# Patient Record
Sex: Female | Born: 1960 | Race: Black or African American | Hispanic: No | Marital: Single | Smoking: Current some day smoker
Health system: Southern US, Community
[De-identification: ages and names within clinical notes are randomized; demographics above are authoritative.]

## PROBLEM LIST (undated history)

## (undated) DIAGNOSIS — I10 Essential (primary) hypertension: Secondary | ICD-10-CM

## (undated) DIAGNOSIS — S7292XA Unspecified fracture of left femur, initial encounter for closed fracture: Secondary | ICD-10-CM

## (undated) DIAGNOSIS — E119 Type 2 diabetes mellitus without complications: Secondary | ICD-10-CM

## (undated) DIAGNOSIS — M1990 Unspecified osteoarthritis, unspecified site: Secondary | ICD-10-CM

## (undated) DIAGNOSIS — G8929 Other chronic pain: Secondary | ICD-10-CM

## (undated) DIAGNOSIS — E7800 Pure hypercholesterolemia, unspecified: Secondary | ICD-10-CM

## (undated) DIAGNOSIS — Z720 Tobacco use: Secondary | ICD-10-CM

## (undated) DIAGNOSIS — E785 Hyperlipidemia, unspecified: Secondary | ICD-10-CM

## (undated) HISTORY — DX: Unspecified fracture of left femur, initial encounter for closed fracture: S72.92XA

## (undated) HISTORY — DX: Tobacco use: Z72.0

## (undated) HISTORY — DX: Unspecified osteoarthritis, unspecified site: M19.90

## (undated) HISTORY — DX: Other chronic pain: G89.29

## (undated) HISTORY — PX: CARPAL TUNNEL RELEASE: SHX101

## (undated) HISTORY — DX: Type 2 diabetes mellitus without complications: E11.9

## (undated) HISTORY — DX: Essential (primary) hypertension: I10

## (undated) HISTORY — DX: Hyperlipidemia, unspecified: E78.5

## (undated) HISTORY — DX: Pure hypercholesterolemia, unspecified: E78.00

## (undated) HISTORY — PX: COLONOSCOPY: SHX174

---

## 2005-03-15 ENCOUNTER — Ambulatory Visit: Payer: Self-pay | Admitting: Family Medicine

## 2005-04-01 ENCOUNTER — Ambulatory Visit: Payer: Self-pay | Admitting: Family Medicine

## 2005-09-28 ENCOUNTER — Ambulatory Visit (HOSPITAL_COMMUNITY): Admission: RE | Admit: 2005-09-28 | Discharge: 2005-09-28 | Payer: Self-pay | Admitting: Specialist

## 2006-12-19 IMAGING — CR DG ORBITS FOR FOREIGN BODY
2 series · 2 of 2 positions shown · non-contrast
Comparison: none

CLINICAL DATA: Pre-MRI.  Exam for patient who works with metal. 
 EYE FOREIGN BODY:
 No radiopaque foreign body.

[w waters *]
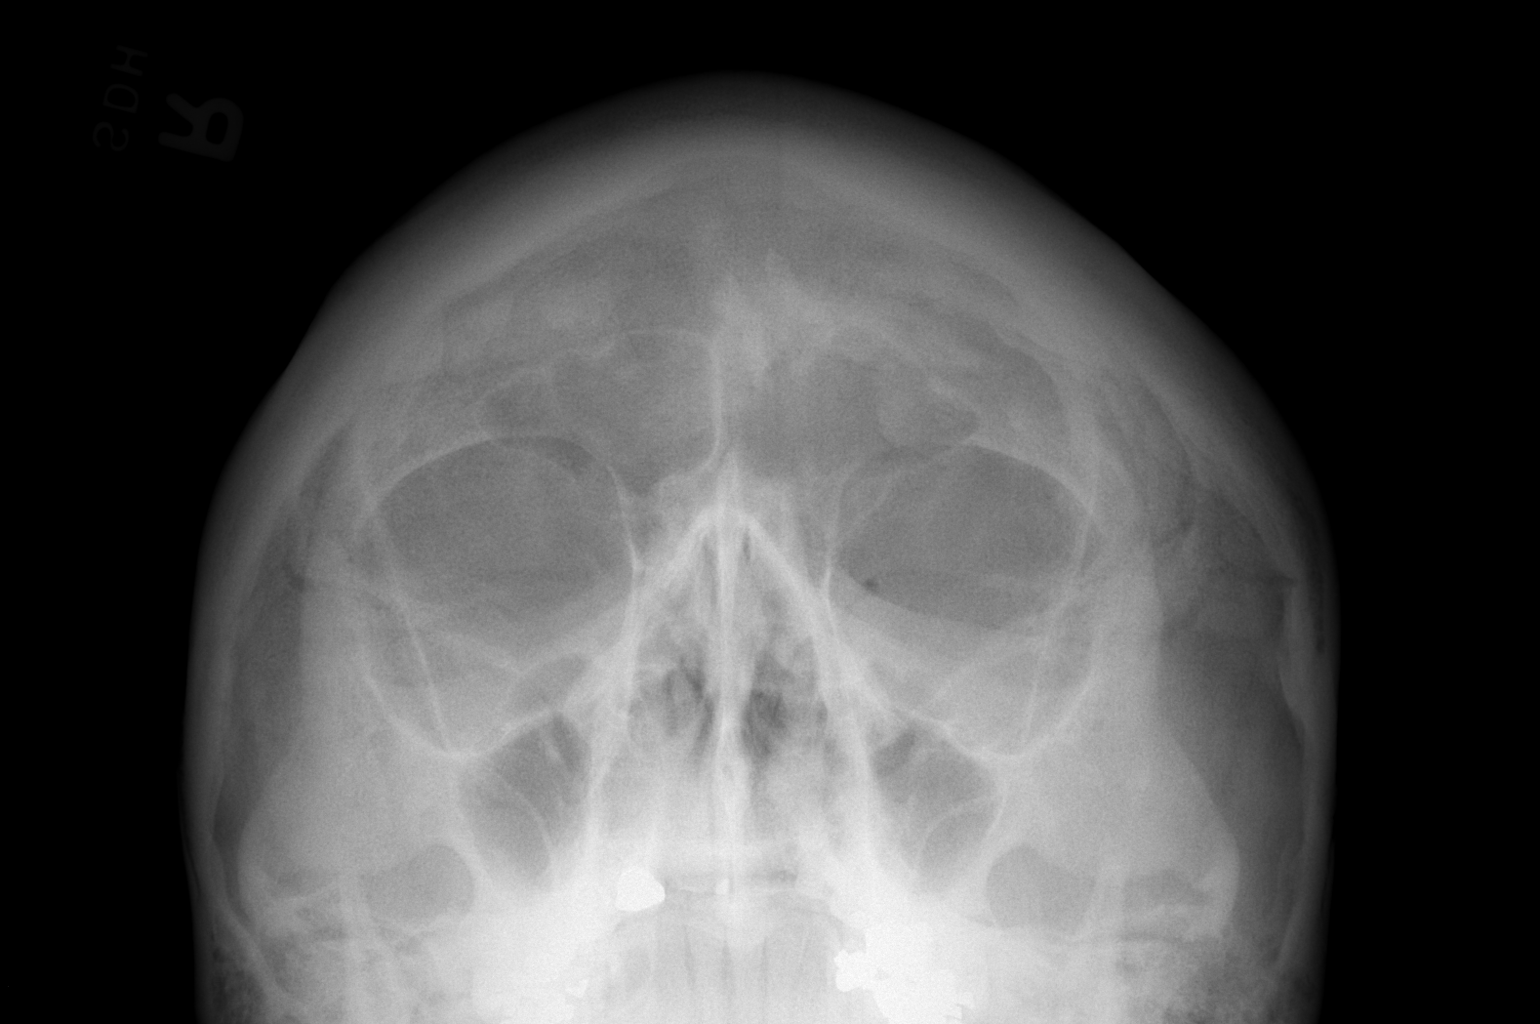

[w waters]
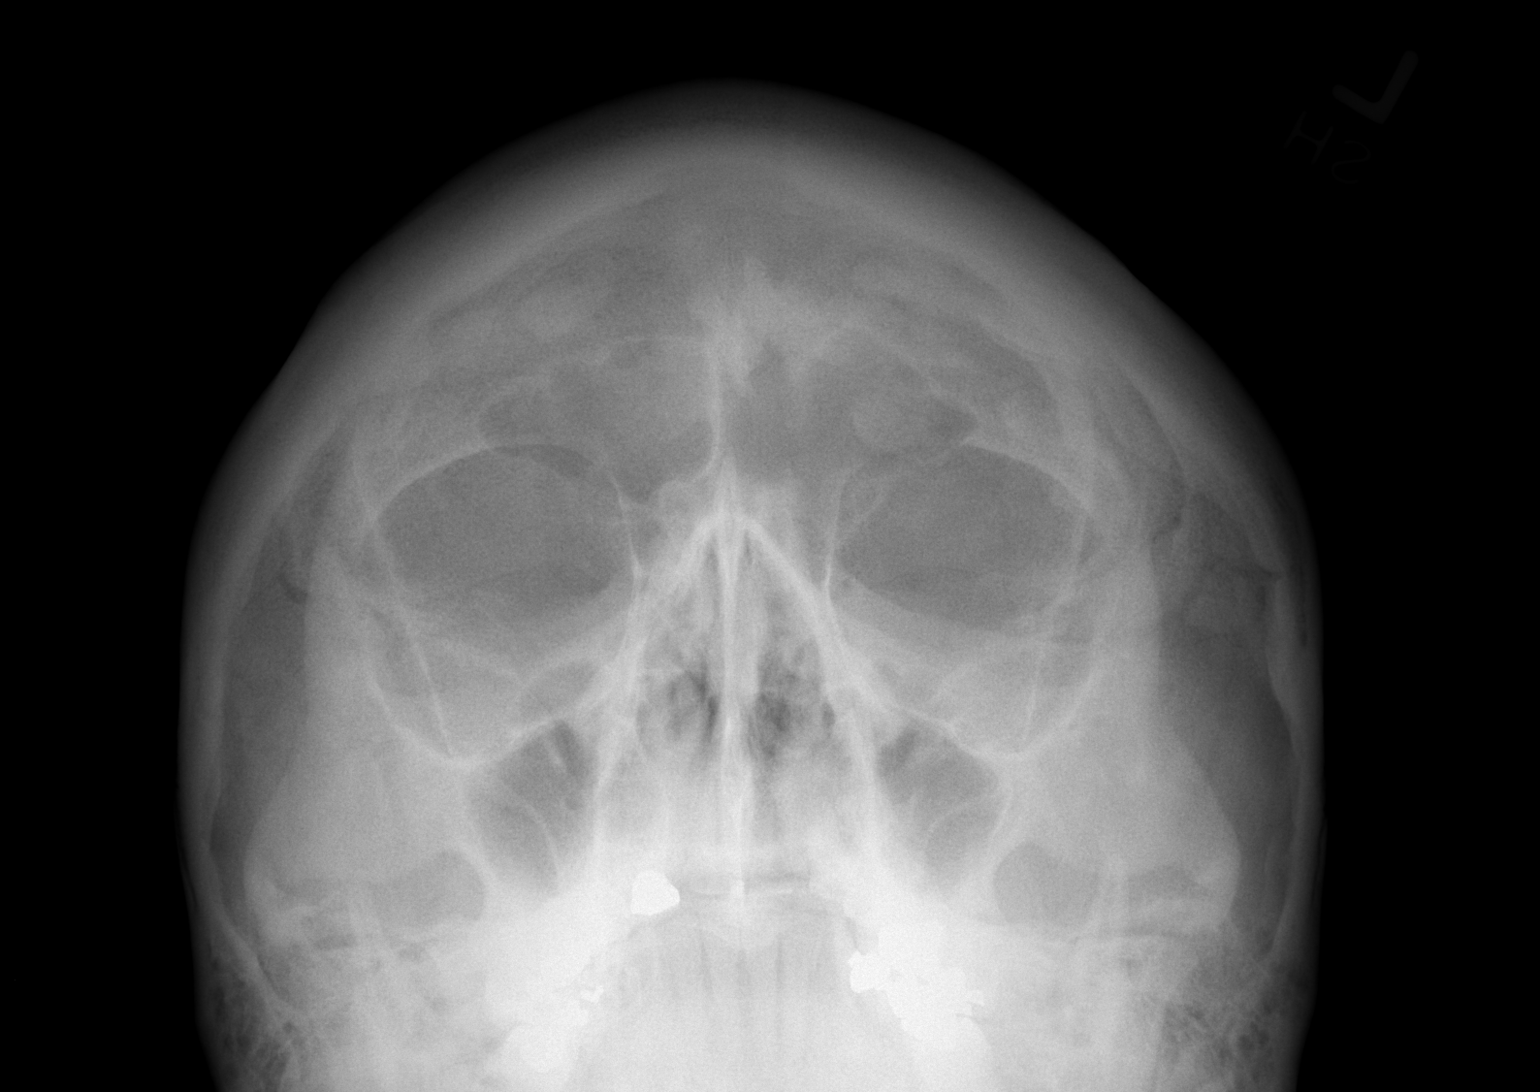

[2 of 2 positions shown; findings below may reference images not displayed]

IMPRESSION: The patient may proceed to MRI.

## 2016-04-18 DIAGNOSIS — E119 Type 2 diabetes mellitus without complications: Secondary | ICD-10-CM | POA: Diagnosis not present

## 2016-04-18 DIAGNOSIS — E663 Overweight: Secondary | ICD-10-CM | POA: Diagnosis not present

## 2016-04-18 DIAGNOSIS — I10 Essential (primary) hypertension: Secondary | ICD-10-CM | POA: Diagnosis not present

## 2016-04-18 DIAGNOSIS — Z713 Dietary counseling and surveillance: Secondary | ICD-10-CM | POA: Diagnosis not present

## 2016-08-08 DIAGNOSIS — E119 Type 2 diabetes mellitus without complications: Secondary | ICD-10-CM | POA: Diagnosis not present

## 2016-08-08 DIAGNOSIS — I10 Essential (primary) hypertension: Secondary | ICD-10-CM | POA: Diagnosis not present

## 2016-08-08 DIAGNOSIS — Z713 Dietary counseling and surveillance: Secondary | ICD-10-CM | POA: Diagnosis not present

## 2016-08-08 DIAGNOSIS — K5900 Constipation, unspecified: Secondary | ICD-10-CM | POA: Diagnosis not present

## 2016-09-16 DIAGNOSIS — J22 Unspecified acute lower respiratory infection: Secondary | ICD-10-CM | POA: Diagnosis not present

## 2016-10-04 DIAGNOSIS — R05 Cough: Secondary | ICD-10-CM | POA: Diagnosis not present

## 2016-10-04 DIAGNOSIS — J0140 Acute pansinusitis, unspecified: Secondary | ICD-10-CM | POA: Diagnosis not present

## 2016-10-05 ENCOUNTER — Ambulatory Visit (INDEPENDENT_AMBULATORY_CARE_PROVIDER_SITE_OTHER): Payer: BLUE CROSS/BLUE SHIELD | Admitting: Sports Medicine

## 2016-10-05 ENCOUNTER — Encounter: Payer: Self-pay | Admitting: Sports Medicine

## 2016-10-05 DIAGNOSIS — M2042 Other hammer toe(s) (acquired), left foot: Secondary | ICD-10-CM

## 2016-10-05 DIAGNOSIS — E119 Type 2 diabetes mellitus without complications: Secondary | ICD-10-CM | POA: Diagnosis not present

## 2016-10-05 DIAGNOSIS — L84 Corns and callosities: Secondary | ICD-10-CM

## 2016-10-05 NOTE — Progress Notes (Signed)
Subjective: Cherlyn Cushingamela Nugent is a 55 y.o. Diabetic female patient who presents to office for evaluation of Left foot pain. Patient complains of progressive pain especially over the last year in the Left foot at the 4-5th toes. Patient has tried soaking and stone with no relief in symptoms. Patient denies any other pedal complaints.   FBS not recorded  A1c 7.1 per patient   There are no active problems to display for this patient.   No current outpatient prescriptions on file prior to visit.   No current facility-administered medications on file prior to visit.     Not on File  Objective:  General: Alert and oriented x3 in no acute distress  Dermatology: Small hyperkeratotic lesion overlying 5 PIPJ dorsally on left foot with kertasis 4th webspace. No open lesions bilateral lower extremities, no webspace macerations, no ecchymosis bilateral, all nails x 10 are short and thickened mildly discolored but well manicured.  Vascular: Dorsalis Pedis and Posterior Tibial pedal pulses 2/4, Capillary Fill Time 3 seconds,(+) pedal hair growth bilateral, no edema bilateral lower extremities, Temperature gradient within normal limits.  Neurology: Gross sensation intact via light touch bilateral, Protective sensation intact with Semmes Weinstein Monofilament to all pedal sites, vibratory intact bilateral, No babinski sign present bilateral. Subjective occassional numbness to feet when her back is hurting. Has history of bulging disc lumbar and cervical.   Musculoskeletal: Semi-flexible hammertoes 5 with varus rotation with mild tenderness with palpation at PIPJ Left. Ankle, Subtalar, Midtarsal, and MTPJ joint range of motion is within normal limits, there is no 1st ray hypermobility noted bilateral, Mild bunion deformity noted bilateral. No pain with calf compression bilateral.  Strength within normal limits in all groups bilateral.        Assessment and Plan: Problem List Items Addressed This Visit     None    Visit Diagnoses    Corns and callosities    -  Primary   Hammertoe of left foot       Diabetes mellitus without complication (HCC)           -Complete examination performed -Discussed treatement options corn secondary to hammertoe -Mechanically debrided keratosis x 2 left foot using sterile chisel blade without incident -Gave silicone toe cap/pad -Recommend daily skin emollients, daily foot inspection in setting of diabetes, and good supportive shoes -Patient to return to office in 3 months or sooner if condition worsens. Encouraged patient to consider surgery if continues to be bothersome.   Asencion Islamitorya Cloyce Paterson, DPM

## 2016-10-05 NOTE — Patient Instructions (Signed)
Diabetes and Foot Care Diabetes may cause you to have problems because of poor blood supply (circulation) to your feet and legs. This may cause the skin on your feet to become thinner, break easier, and heal more slowly. Your skin may become dry, and the skin may peel and crack. You may also have nerve damage in your legs and feet causing decreased feeling in them. You may not notice minor injuries to your feet that could lead to infections or more serious problems. Taking care of your feet is one of the most important things you can do for yourself.  HOME CARE INSTRUCTIONS  Wear shoes at all times, even in the house. Do not go barefoot. Bare feet are easily injured.  Check your feet daily for blisters, cuts, and redness. If you cannot see the bottom of your feet, use a mirror or ask someone for help.  Wash your feet with warm water (do not use hot water) and mild soap. Then pat your feet and the areas between your toes until they are completely dry. Do not soak your feet as this can dry your skin.  Apply a moisturizing lotion or petroleum jelly (that does not contain alcohol and is unscented) to the skin on your feet and to dry, brittle toenails. Do not apply lotion between your toes.  Trim your toenails straight across. Do not dig under them or around the cuticle. File the edges of your nails with an emery board or nail file.  Do not cut corns or calluses or try to remove them with medicine.  Wear clean socks or stockings every day. Make sure they are not too tight. Do not wear knee-high stockings since they may decrease blood flow to your legs.  Wear shoes that fit properly and have enough cushioning. To break in new shoes, wear them for just a few hours a day. This prevents you from injuring your feet. Always look in your shoes before you put them on to be sure there are no objects inside.  Do not cross your legs. This may decrease the blood flow to your feet.  If you find a minor scrape,  cut, or break in the skin on your feet, keep it and the skin around it clean and dry. These areas may be cleansed with mild soap and water. Do not cleanse the area with peroxide, alcohol, or iodine.  When you remove an adhesive bandage, be sure not to damage the skin around it.  If you have a wound, look at it several times a day to make sure it is healing.  Do not use heating pads or hot water bottles. They may burn your skin. If you have lost feeling in your feet or legs, you may not know it is happening until it is too late.  Make sure your health care provider performs a complete foot exam at least annually or more often if you have foot problems. Report any cuts, sores, or bruises to your health care provider immediately. SEEK MEDICAL CARE IF:   You have an injury that is not healing.  You have cuts or breaks in the skin.  You have an ingrown nail.  You notice redness on your legs or feet.  You feel burning or tingling in your legs or feet.  You have pain or cramps in your legs and feet.  Your legs or feet are numb.  Your feet always feel cold. SEEK IMMEDIATE MEDICAL CARE IF:   There is increasing redness,   swelling, or pain in or around a wound.  There is a red line that goes up your leg.  Pus is coming from a wound.  You develop a fever or as directed by your health care provider.  You notice a bad smell coming from an ulcer or wound.   This information is not intended to replace advice given to you by your health care provider. Make sure you discuss any questions you have with your health care provider.   Document Released: 12/09/2000 Document Revised: 08/14/2013 Document Reviewed: 05/21/2013 Elsevier Interactive Patient Education 2016 [employer]  

## 2017-01-05 ENCOUNTER — Ambulatory Visit: Payer: BLUE CROSS/BLUE SHIELD | Admitting: Sports Medicine

## 2017-01-30 DIAGNOSIS — E559 Vitamin D deficiency, unspecified: Secondary | ICD-10-CM | POA: Diagnosis not present

## 2017-01-30 DIAGNOSIS — R5383 Other fatigue: Secondary | ICD-10-CM | POA: Diagnosis not present

## 2017-01-30 DIAGNOSIS — Z79899 Other long term (current) drug therapy: Secondary | ICD-10-CM | POA: Diagnosis not present

## 2017-01-30 DIAGNOSIS — E785 Hyperlipidemia, unspecified: Secondary | ICD-10-CM | POA: Diagnosis not present

## 2017-01-30 DIAGNOSIS — E1169 Type 2 diabetes mellitus with other specified complication: Secondary | ICD-10-CM | POA: Diagnosis not present

## 2017-01-30 DIAGNOSIS — I10 Essential (primary) hypertension: Secondary | ICD-10-CM | POA: Diagnosis not present

## 2017-01-30 DIAGNOSIS — F329 Major depressive disorder, single episode, unspecified: Secondary | ICD-10-CM | POA: Diagnosis not present

## 2017-03-01 DIAGNOSIS — I10 Essential (primary) hypertension: Secondary | ICD-10-CM | POA: Diagnosis not present

## 2017-03-01 DIAGNOSIS — E1169 Type 2 diabetes mellitus with other specified complication: Secondary | ICD-10-CM | POA: Diagnosis not present

## 2017-03-01 DIAGNOSIS — R51 Headache: Secondary | ICD-10-CM | POA: Diagnosis not present

## 2017-03-01 DIAGNOSIS — E785 Hyperlipidemia, unspecified: Secondary | ICD-10-CM | POA: Diagnosis not present

## 2017-03-13 DIAGNOSIS — R51 Headache: Secondary | ICD-10-CM | POA: Diagnosis not present

## 2017-03-13 DIAGNOSIS — E785 Hyperlipidemia, unspecified: Secondary | ICD-10-CM | POA: Diagnosis not present

## 2017-03-13 DIAGNOSIS — E1169 Type 2 diabetes mellitus with other specified complication: Secondary | ICD-10-CM | POA: Diagnosis not present

## 2017-03-13 DIAGNOSIS — M47812 Spondylosis without myelopathy or radiculopathy, cervical region: Secondary | ICD-10-CM | POA: Diagnosis not present

## 2017-05-09 DIAGNOSIS — I10 Essential (primary) hypertension: Secondary | ICD-10-CM | POA: Diagnosis not present

## 2017-05-09 DIAGNOSIS — E1169 Type 2 diabetes mellitus with other specified complication: Secondary | ICD-10-CM | POA: Diagnosis not present

## 2017-05-09 DIAGNOSIS — E785 Hyperlipidemia, unspecified: Secondary | ICD-10-CM | POA: Diagnosis not present

## 2017-05-09 DIAGNOSIS — E11628 Type 2 diabetes mellitus with other skin complications: Secondary | ICD-10-CM | POA: Diagnosis not present

## 2017-05-09 DIAGNOSIS — L84 Corns and callosities: Secondary | ICD-10-CM | POA: Diagnosis not present

## 2017-05-09 DIAGNOSIS — E559 Vitamin D deficiency, unspecified: Secondary | ICD-10-CM | POA: Diagnosis not present

## 2017-05-09 DIAGNOSIS — Z720 Tobacco use: Secondary | ICD-10-CM | POA: Diagnosis not present

## 2017-05-10 DIAGNOSIS — H35033 Hypertensive retinopathy, bilateral: Secondary | ICD-10-CM | POA: Diagnosis not present

## 2017-05-10 DIAGNOSIS — E119 Type 2 diabetes mellitus without complications: Secondary | ICD-10-CM | POA: Diagnosis not present

## 2017-05-17 DIAGNOSIS — E1169 Type 2 diabetes mellitus with other specified complication: Secondary | ICD-10-CM | POA: Diagnosis not present

## 2017-05-17 DIAGNOSIS — E785 Hyperlipidemia, unspecified: Secondary | ICD-10-CM | POA: Diagnosis not present

## 2017-05-17 DIAGNOSIS — E669 Obesity, unspecified: Secondary | ICD-10-CM | POA: Diagnosis not present

## 2017-06-14 DIAGNOSIS — E785 Hyperlipidemia, unspecified: Secondary | ICD-10-CM | POA: Diagnosis not present

## 2017-06-14 DIAGNOSIS — E1169 Type 2 diabetes mellitus with other specified complication: Secondary | ICD-10-CM | POA: Diagnosis not present

## 2017-06-14 DIAGNOSIS — E559 Vitamin D deficiency, unspecified: Secondary | ICD-10-CM | POA: Diagnosis not present

## 2017-08-25 DIAGNOSIS — E559 Vitamin D deficiency, unspecified: Secondary | ICD-10-CM | POA: Diagnosis not present

## 2017-08-25 DIAGNOSIS — R42 Dizziness and giddiness: Secondary | ICD-10-CM | POA: Diagnosis not present

## 2017-08-25 DIAGNOSIS — E1169 Type 2 diabetes mellitus with other specified complication: Secondary | ICD-10-CM | POA: Diagnosis not present

## 2017-08-25 DIAGNOSIS — E785 Hyperlipidemia, unspecified: Secondary | ICD-10-CM | POA: Diagnosis not present

## 2017-08-25 DIAGNOSIS — I10 Essential (primary) hypertension: Secondary | ICD-10-CM | POA: Diagnosis not present

## 2017-08-30 DIAGNOSIS — E1169 Type 2 diabetes mellitus with other specified complication: Secondary | ICD-10-CM | POA: Diagnosis not present

## 2017-08-30 DIAGNOSIS — E041 Nontoxic single thyroid nodule: Secondary | ICD-10-CM | POA: Diagnosis not present

## 2017-08-30 DIAGNOSIS — R946 Abnormal results of thyroid function studies: Secondary | ICD-10-CM | POA: Diagnosis not present

## 2017-08-30 DIAGNOSIS — E785 Hyperlipidemia, unspecified: Secondary | ICD-10-CM | POA: Diagnosis not present

## 2017-10-03 DIAGNOSIS — E041 Nontoxic single thyroid nodule: Secondary | ICD-10-CM | POA: Diagnosis not present

## 2017-10-03 DIAGNOSIS — M549 Dorsalgia, unspecified: Secondary | ICD-10-CM | POA: Diagnosis not present

## 2017-10-03 DIAGNOSIS — R946 Abnormal results of thyroid function studies: Secondary | ICD-10-CM | POA: Diagnosis not present

## 2017-10-09 DIAGNOSIS — M549 Dorsalgia, unspecified: Secondary | ICD-10-CM | POA: Diagnosis not present

## 2017-10-22 DIAGNOSIS — M5416 Radiculopathy, lumbar region: Secondary | ICD-10-CM | POA: Diagnosis not present

## 2017-10-25 DIAGNOSIS — M549 Dorsalgia, unspecified: Secondary | ICD-10-CM | POA: Diagnosis not present

## 2017-11-08 DIAGNOSIS — M545 Low back pain: Secondary | ICD-10-CM | POA: Diagnosis not present

## 2018-02-12 DIAGNOSIS — S82044A Nondisplaced comminuted fracture of right patella, initial encounter for closed fracture: Secondary | ICD-10-CM | POA: Diagnosis not present

## 2018-05-28 DIAGNOSIS — E1169 Type 2 diabetes mellitus with other specified complication: Secondary | ICD-10-CM | POA: Diagnosis not present

## 2018-05-28 DIAGNOSIS — F430 Acute stress reaction: Secondary | ICD-10-CM | POA: Diagnosis not present

## 2018-05-28 DIAGNOSIS — Z01419 Encounter for gynecological examination (general) (routine) without abnormal findings: Secondary | ICD-10-CM | POA: Diagnosis not present

## 2018-05-28 DIAGNOSIS — Z1211 Encounter for screening for malignant neoplasm of colon: Secondary | ICD-10-CM | POA: Diagnosis not present

## 2018-05-28 DIAGNOSIS — Z1231 Encounter for screening mammogram for malignant neoplasm of breast: Secondary | ICD-10-CM | POA: Diagnosis not present

## 2018-07-19 DIAGNOSIS — I10 Essential (primary) hypertension: Secondary | ICD-10-CM | POA: Diagnosis not present

## 2018-07-19 DIAGNOSIS — F430 Acute stress reaction: Secondary | ICD-10-CM | POA: Diagnosis not present

## 2018-07-19 DIAGNOSIS — E1169 Type 2 diabetes mellitus with other specified complication: Secondary | ICD-10-CM | POA: Diagnosis not present

## 2018-07-19 DIAGNOSIS — F329 Major depressive disorder, single episode, unspecified: Secondary | ICD-10-CM | POA: Diagnosis not present

## 2018-12-06 DIAGNOSIS — E559 Vitamin D deficiency, unspecified: Secondary | ICD-10-CM | POA: Diagnosis not present

## 2018-12-06 DIAGNOSIS — I10 Essential (primary) hypertension: Secondary | ICD-10-CM | POA: Diagnosis not present

## 2018-12-06 DIAGNOSIS — Z1231 Encounter for screening mammogram for malignant neoplasm of breast: Secondary | ICD-10-CM | POA: Diagnosis not present

## 2018-12-06 DIAGNOSIS — F329 Major depressive disorder, single episode, unspecified: Secondary | ICD-10-CM | POA: Diagnosis not present

## 2018-12-06 DIAGNOSIS — E1169 Type 2 diabetes mellitus with other specified complication: Secondary | ICD-10-CM | POA: Diagnosis not present

## 2018-12-24 DIAGNOSIS — Z79899 Other long term (current) drug therapy: Secondary | ICD-10-CM | POA: Diagnosis not present

## 2019-01-16 DIAGNOSIS — Z1231 Encounter for screening mammogram for malignant neoplasm of breast: Secondary | ICD-10-CM | POA: Diagnosis not present

## 2019-01-17 DIAGNOSIS — E875 Hyperkalemia: Secondary | ICD-10-CM | POA: Diagnosis not present

## 2019-01-17 DIAGNOSIS — I10 Essential (primary) hypertension: Secondary | ICD-10-CM | POA: Diagnosis not present

## 2019-01-17 DIAGNOSIS — Z6833 Body mass index (BMI) 33.0-33.9, adult: Secondary | ICD-10-CM | POA: Diagnosis not present

## 2019-01-30 DIAGNOSIS — R921 Mammographic calcification found on diagnostic imaging of breast: Secondary | ICD-10-CM | POA: Diagnosis not present

## 2019-01-30 DIAGNOSIS — R928 Other abnormal and inconclusive findings on diagnostic imaging of breast: Secondary | ICD-10-CM | POA: Diagnosis not present

## 2019-02-02 DIAGNOSIS — R030 Elevated blood-pressure reading, without diagnosis of hypertension: Secondary | ICD-10-CM | POA: Diagnosis not present

## 2019-02-02 DIAGNOSIS — M549 Dorsalgia, unspecified: Secondary | ICD-10-CM | POA: Diagnosis not present

## 2019-04-11 DIAGNOSIS — J069 Acute upper respiratory infection, unspecified: Secondary | ICD-10-CM | POA: Diagnosis not present

## 2019-04-11 DIAGNOSIS — I10 Essential (primary) hypertension: Secondary | ICD-10-CM | POA: Diagnosis not present

## 2019-04-11 DIAGNOSIS — F329 Major depressive disorder, single episode, unspecified: Secondary | ICD-10-CM | POA: Diagnosis not present

## 2019-04-11 DIAGNOSIS — J309 Allergic rhinitis, unspecified: Secondary | ICD-10-CM | POA: Diagnosis not present

## 2019-04-15 DIAGNOSIS — J302 Other seasonal allergic rhinitis: Secondary | ICD-10-CM | POA: Diagnosis not present

## 2019-04-15 DIAGNOSIS — I10 Essential (primary) hypertension: Secondary | ICD-10-CM | POA: Diagnosis not present

## 2019-04-23 DIAGNOSIS — R42 Dizziness and giddiness: Secondary | ICD-10-CM | POA: Diagnosis not present

## 2019-04-23 DIAGNOSIS — R51 Headache: Secondary | ICD-10-CM | POA: Diagnosis not present

## 2019-04-23 DIAGNOSIS — R5383 Other fatigue: Secondary | ICD-10-CM | POA: Diagnosis not present

## 2019-04-24 DIAGNOSIS — I10 Essential (primary) hypertension: Secondary | ICD-10-CM | POA: Diagnosis not present

## 2019-04-24 DIAGNOSIS — N270 Small kidney, unilateral: Secondary | ICD-10-CM | POA: Diagnosis not present

## 2019-04-24 DIAGNOSIS — N179 Acute kidney failure, unspecified: Secondary | ICD-10-CM | POA: Diagnosis not present

## 2019-04-24 DIAGNOSIS — Z79899 Other long term (current) drug therapy: Secondary | ICD-10-CM | POA: Diagnosis not present

## 2019-04-24 DIAGNOSIS — E1169 Type 2 diabetes mellitus with other specified complication: Secondary | ICD-10-CM | POA: Diagnosis not present

## 2019-04-24 DIAGNOSIS — N183 Chronic kidney disease, stage 3 (moderate): Secondary | ICD-10-CM | POA: Diagnosis not present

## 2019-04-24 DIAGNOSIS — R42 Dizziness and giddiness: Secondary | ICD-10-CM | POA: Diagnosis not present

## 2019-05-08 DIAGNOSIS — R42 Dizziness and giddiness: Secondary | ICD-10-CM | POA: Diagnosis not present

## 2019-05-08 DIAGNOSIS — I10 Essential (primary) hypertension: Secondary | ICD-10-CM | POA: Diagnosis not present

## 2019-05-08 DIAGNOSIS — E0590 Thyrotoxicosis, unspecified without thyrotoxic crisis or storm: Secondary | ICD-10-CM | POA: Diagnosis not present

## 2019-05-08 DIAGNOSIS — E1169 Type 2 diabetes mellitus with other specified complication: Secondary | ICD-10-CM | POA: Diagnosis not present

## 2019-05-10 DIAGNOSIS — E041 Nontoxic single thyroid nodule: Secondary | ICD-10-CM | POA: Diagnosis not present

## 2019-05-10 DIAGNOSIS — E010 Iodine-deficiency related diffuse (endemic) goiter: Secondary | ICD-10-CM | POA: Diagnosis not present

## 2019-05-10 DIAGNOSIS — E042 Nontoxic multinodular goiter: Secondary | ICD-10-CM | POA: Diagnosis not present

## 2019-05-24 DIAGNOSIS — E041 Nontoxic single thyroid nodule: Secondary | ICD-10-CM | POA: Diagnosis not present

## 2019-06-07 DIAGNOSIS — M25569 Pain in unspecified knee: Secondary | ICD-10-CM | POA: Diagnosis not present

## 2019-06-07 DIAGNOSIS — R51 Headache: Secondary | ICD-10-CM | POA: Diagnosis not present

## 2019-06-07 DIAGNOSIS — F329 Major depressive disorder, single episode, unspecified: Secondary | ICD-10-CM | POA: Diagnosis not present

## 2019-06-07 DIAGNOSIS — Z1331 Encounter for screening for depression: Secondary | ICD-10-CM | POA: Diagnosis not present

## 2019-06-07 DIAGNOSIS — I10 Essential (primary) hypertension: Secondary | ICD-10-CM | POA: Diagnosis not present

## 2019-06-27 ENCOUNTER — Other Ambulatory Visit: Payer: Self-pay

## 2019-06-27 ENCOUNTER — Ambulatory Visit: Payer: BC Managed Care – PPO | Admitting: Neurology

## 2019-06-27 ENCOUNTER — Encounter: Payer: Self-pay | Admitting: Neurology

## 2019-06-27 VITALS — Temp 98.0°F | Ht 65.0 in | Wt 194.0 lb

## 2019-06-27 DIAGNOSIS — R51 Headache with orthostatic component, not elsewhere classified: Secondary | ICD-10-CM

## 2019-06-27 DIAGNOSIS — G4719 Other hypersomnia: Secondary | ICD-10-CM | POA: Diagnosis not present

## 2019-06-27 DIAGNOSIS — H539 Unspecified visual disturbance: Secondary | ICD-10-CM

## 2019-06-27 DIAGNOSIS — H93A9 Pulsatile tinnitus, unspecified ear: Secondary | ICD-10-CM

## 2019-06-27 DIAGNOSIS — R0683 Snoring: Secondary | ICD-10-CM

## 2019-06-27 DIAGNOSIS — E538 Deficiency of other specified B group vitamins: Secondary | ICD-10-CM | POA: Diagnosis not present

## 2019-06-27 DIAGNOSIS — G8929 Other chronic pain: Secondary | ICD-10-CM

## 2019-06-27 DIAGNOSIS — G43009 Migraine without aura, not intractable, without status migrainosus: Secondary | ICD-10-CM

## 2019-06-27 DIAGNOSIS — G4484 Primary exertional headache: Secondary | ICD-10-CM

## 2019-06-27 DIAGNOSIS — R519 Headache, unspecified: Secondary | ICD-10-CM

## 2019-06-27 DIAGNOSIS — G441 Vascular headache, not elsewhere classified: Secondary | ICD-10-CM

## 2019-06-27 MED ORDER — ALPRAZOLAM 0.25 MG PO TABS: ORAL_TABLET | ORAL | 0 refills | Status: DC

## 2019-06-27 NOTE — Progress Notes (Signed)
[facility]    Provider:  Dr Lucia GaskinsAhern Requesting Provider: Marlyn CorporalYates, Kate H, PA Primary Care Provider:  Marlyn CorporalYates, Kate H, PA  CC:  Headaches  HPI:  Cheryl Cockayneamela F Oneal is a 58 y.o. female here as requested by Marlyn CorporalYates, Kate H, PA for dizziness/headaches.  She has a past medical history of elevated blood pressure which has been recently not well controlled up to 220 and seen in the emergency room, diabetes with hemoglobin A1c last 8.5%, hyperlipidemia.  It appears her hemoglobin A1c did improve as of last appointment May 13 20-7.  She was recently on Trulicity but stopped it because of side effects.  She is under significant amount of stress.  She is on amitriptyline and Norvasc.  She is a tobacco user, also major depression, vitamin D deficiency.  Occasional alcohol use.  Current every day smoker 1 pack/day.  She has a headache, a tightness, it happens with exertion when she is doing something, so bad she doesn't want to talk. She has to sit down. It can happen even when she is simply walking, she feels lightheaded with valsalva. Home BPs in the 160s. Feels like pressure, tightness, headache on the top of the head, she has them daily multiple times a day, but she walks around with a headache every day all day. She wakes up with headaches. She snores. She wakes up with dry mouth. She has drainage. Gets worse with standing. She is very tired during the day. She wakes up often. She feels foggy and reports memory problems. Very forgetful. SHe needs to sit down and be still. Mostly oressue, not pulsating or throbbing. No light or sound sensitivity. Has a remoe history of migraines and this is not similar. She has vision changes, blurry vision. Headaches getting worse, progressive. Staying still makes it better. Also dizziness with walking. She is so dizzy she has to steady herself. Started January. +Pulsating in the ears like a heartbeat.  No other focal neurologic deficits, associated symptoms, inciting  events or modifiable factors.  Meds tried: Amitriptyline, norvasc  Reviewed notes, labs and imaging from outside physicians, which showed:  I reviewed Marlyn CorporalYates, Kate H, PA notes.  Patient has persistent headaches, dizziness, work-up so far at primary care has been negative, including labs thyroid.  Ongoing for several months.  She has had a blood pressure issue.  TSH was low, has had headaches, fatigue, elevated blood pressure.  She was followed 10 years prior a week for thyroid disease unsure biopsy.  She has had hair loss anxiety as well.  Hypertension has been rapidly worsening, she is not following a special diet, not exercising, home systolic readings average 1 40-1 50 and diastolic 70-80.  Compliance to medication and calcium channel blocker is excellent.  Stopped her lisinopril due to abnormal kidney functions.  On Norvasc for 2 weeks.  She has ongoing headaches for several months, located frontal and top of the head, every morning she wakes up with a headache, her blood pressure has not been well controlled, she is on amitriptyline daily for anxiety but has not noticed improvement in migraine management, no visual changes, numbness, tingling.  B12 recently normal per note.  She was seen at urgent care for headache and dizziness, her creatinine was found to be abnormal elevated to 1.5 with an elevated blood pressure the prior week.  Her blood pressure was up to 220 systolic and they added 25 mg hydrochlorthiazide at the urgent care or emergency room.  This is helped.  She  has been under excessive stress recently.  Hemoglobin A1c reduced from 8.5-7.  I reviewed exam which was normal including neurologic exam.  CMP showed BUN 17 and creatinine 0.87, TSH 0.495 completed in May 2020.  She has been in menopause for over 4 years.  Cholesterol LDL 95, triglycerides 308, HDL 31.  TSH 0.361 April 24, 2019.  Review of Systems: Patient complains of symptoms per HPI as well as the following symptoms: headache,  insomnia, numbness, dizziness, depression, anxiey. Pertinent negatives and positives per HPI. All others negative.   Social History   Socioeconomic History   Marital status: Single    Spouse name: Not on file   Number of children: 3   Years of education: Not on file   Highest education level: Some college, no degree  Occupational History   Not on file  Social Needs   Financial resource strain: Not on file   Food insecurity    Worry: Not on file    Inability: Not on file   Transportation needs    Medical: Not on file    Non-medical: Not on file  Tobacco Use   Smoking status: Current Some Day Smoker    Packs/day: 1.50   Smokeless tobacco: Never Used  Substance and Sexual Activity   Alcohol use: Never    Frequency: Never   Drug use: Never   Sexual activity: Not on file  Lifestyle   Physical activity    Days per week: Not on file    Minutes per session: Not on file   Stress: Not on file  Relationships   Social connections    Talks on phone: Not on file    Gets together: Not on file    Attends religious service: Not on file    Active member of club or organization: Not on file    Attends meetings of clubs or organizations: Not on file    Relationship status: Not on file   Intimate partner violence    Fear of current or ex partner: Not on file    Emotionally abused: Not on file    Physically abused: Not on file    Forced sexual activity: Not on file  Other Topics Concern   Not on file  Social History Narrative   Lives at home with daughter & son   Right handed    Family History  Problem Relation Age of Onset   Heart failure Mother    High blood pressure Other        on both sides of family   Diabetes Other        on both sides of family    Past Medical History:  Diagnosis Date   Diabetes (HCC)    High blood pressure    High cholesterol     Patient Active Problem List   Diagnosis Date Noted   Chronic intractable headache  06/30/2019   Migraine without aura and without status migrainosus, not intractable 06/30/2019    Past Surgical History:  Procedure Laterality Date   CESAREAN SECTION  1989   CESAREAN SECTION  1987    Current Outpatient Medications  Medication Sig Dispense Refill   amitriptyline (ELAVIL) 50 MG tablet Take 50 mg by mouth at bedtime.     amLODipine (NORVASC) 10 MG tablet Take 10 mg by mouth daily.     aspirin 81 MG chewable tablet Chew 81 mg by mouth daily.     ergocalciferol (VITAMIN D2) 1.25 MG (50000 UT) capsule Take 50,000  Units by mouth once a week.     hydrochlorothiazide (HYDRODIURIL) 12.5 MG tablet Take 25 mg by mouth daily.     linaGLIPtin-metFORMIN HCl (JENTADUETO PO) Take 5 mg by mouth daily.     montelukast (SINGULAIR) 10 MG tablet Take 10 mg by mouth at bedtime.     pravastatin (PRAVACHOL) 10 MG tablet Take 10 mg by mouth daily.     ALPRAZolam (XANAX) 0.25 MG tablet Take 1-2 tabs (0.25mg -0.50mg ) 30-60 minutes before procedure. May repeat if needed.Do not drive. 4 tablet 0   No current facility-administered medications for this visit.     Allergies as of 06/27/2019   (Not on File)    Vitals: Temp 98 F (36.7 C) Comment: taken by front staff   Ht 5\' 5"  (1.651 m)    Wt 194 lb (88 kg)    LMP  (LMP Unknown)    BMI 32.28 kg/m  Last Weight:  Wt Readings from Last 1 Encounters:  06/27/19 194 lb (88 kg)   Last Height:   Ht Readings from Last 1 Encounters:  06/27/19 5\' 5"  (1.651 m)   123/84 p 99 sitting  Physical exam: Exam: Gen: NAD, conversant, well nourised, obese, well groomed                     CV: RRR, no MRG. No Carotid Bruits. No peripheral edema, warm, nontender Eyes: Conjunctivae clear without exudates or hemorrhage  Neuro: Detailed Neurologic Exam  Speech:    Speech is normal; fluent and spontaneous with normal comprehension.  Cognition:    The patient is oriented to person, place, and time;     recent and remote memory intact;      language fluent;     normal attention, concentration,     fund of knowledge Cranial Nerves:    The pupils are equal, round, and reactive to light. The fundi are normal and spontaneous venous pulsations are present. Visual fields are full to finger confrontation. Extraocular movements are intact. Trigeminal sensation is intact and the muscles of mastication are normal. The face is symmetric. The palate elevates in the midline. Hearing intact. Voice is normal. Shoulder shrug is normal. The tongue has normal motion without fasciculations.   Coordination:    Normal finger to nose and heel to shin. Normal rapid alternating movements.   Gait:    Heel-toe and tandem gait are normal.   Motor Observation:    No asymmetry, no atrophy, and no involuntary movements noted. Tone:    Normal muscle tone.    Posture:    Posture is normal. normal erect    Strength:    Strength is V/V in the upper and lower limbs.      Sensation: intact to LT     Reflex Exam:  DTR's:    Deep tendon reflexes in the upper and lower extremities are normal bilaterally.   Toes:    The toes are downgoing bilaterally.   Clonus:    Clonus is absent.    Assessment/Plan:  58 year old patient with headaches that are not migraines in nature. She needs a thorough evaluation due to concerning symptoms:  - MRI of the brain w/wo contrast: MRI brain due to concerning symptoms of morning headaches, positional headaches,vision changes  to look for space occupying mass, chiari or intracranial hypertension (pseudotumor), consider PRES due to BP up to 220, also csf leak due to positional quality, need MRI brain and MRA head  - Sleep study: She is  very tired during the day. She wakes up often. She feels foggy and reports memory problems. Very forgetful. Morning dry mouth. Fatigued. Snoring. ESS 12. - Xanax for MRI - Need eye exam, has an appointment - Tingling in toes, likely diabetic will check B12 as well  Orders Placed This  Encounter  Procedures   MR BRAIN W WO CONTRAST   MR ANGIO HEAD WO CONTRAST   Comprehensive metabolic panel   CBC with Differential/Platelets   TSH   B12 and Folate Panel   Methylmalonic acid, serum   Ambulatory referral to Sleep Studies   Meds ordered this encounter  Medications   ALPRAZolam (XANAX) 0.25 MG tablet    Sig: Take 1-2 tabs (0.25mg -0.50mg ) 30-60 minutes before procedure. May repeat if needed.Do not drive.    Dispense:  4 tablet    Refill:  0   Discussed: To prevent or relieve headaches, try the following: Cool Compress. Lie down and place a cool compress on your head.  Avoid headache triggers. If certain foods or odors seem to have triggered your migraines in the past, avoid them. A headache diary might help you identify triggers.  Include physical activity in your daily routine. Try a daily walk or other moderate aerobic exercise.  Manage stress. Find healthy ways to cope with the stressors, such as delegating tasks on your to-do list.  Practice relaxation techniques. Try deep breathing, yoga, massage and visualization.  Eat regularly. Eating regularly scheduled meals and maintaining a healthy diet might help prevent headaches. Also, drink plenty of fluids.  Follow a regular sleep schedule. Sleep deprivation might contribute to headaches Consider biofeedback. With this mind-body technique, you learn to control certain bodily functions -- such as muscle tension, heart rate and blood pressure -- to prevent headaches or reduce headache pain.    Proceed to emergency room if you experience new or worsening symptoms or symptoms do not resolve, if you have new neurologic symptoms or if headache is severe, or for any concerning symptom.   Provided education and documentation from American headache Society toolbox including articles on: chronic migraine medication overuse headache, chronic migraines, prevention of migraines, behavioral and other nonpharmacologic treatments  for headache.   Cc: Marlyn CorporalYates, Kate H, PA,    Naomie DeanAntonia Ferman Basilio, MD  [facility] [address] [place], [place] [id number]  Phone [phone] Fax [phone]

## 2019-06-27 NOTE — Patient Instructions (Signed)
MRI of the brain and MRA of the head Xanax for claustrophobia Evaluate sleep doctor appointment Labwork After workup follow up with you  Alprazolam tablets What is this medicine? ALPRAZOLAM (al PRAY zoe lam) is a benzodiazepine. It is used to treat anxiety and panic attacks. This medicine may be used for other purposes; ask your health care provider or pharmacist if you have questions. COMMON BRAND NAME(S): Xanax What should I tell my health care provider before I take this medicine? They need to know if you have any of these conditions:  an alcohol or drug abuse problem  bipolar disorder, depression, psychosis or other mental health conditions  glaucoma  kidney or liver disease  lung or breathing disease  myasthenia gravis  Parkinson's disease  porphyria  seizures or a history of seizures  suicidal thoughts  an unusual or allergic reaction to alprazolam, other benzodiazepines, foods, dyes, or preservatives  pregnant or trying to get pregnant  breast-feeding How should I use this medicine? Take this medicine by mouth with a glass of water. Follow the directions on the prescription label. Take your medicine at regular intervals. Do not take it more often than directed. Do not stop taking except on your doctor's advice. A special MedGuide will be given to you by the pharmacist with each prescription and refill. Be sure to read this information carefully each time. Talk to your pediatrician regarding the use of this medicine in children. Special care may be needed. Overdosage: If you think you have taken too much of this medicine contact a poison control center or emergency room at once. NOTE: This medicine is only for you. Do not share this medicine with others. What if I miss a dose? If you miss a dose, take it as soon as you can. If it is almost time for your next dose, take only that dose. Do not take double or extra doses. What may interact with this medicine? Do not  take this medicine with any of the following medications:  certain antiviral medicines for HIV or AIDS like delavirdine, indinavir  certain medicines for fungal infections like ketoconazole and itraconazole  narcotic medicines for cough  sodium oxybate This medicine may also interact with the following medications:  alcohol  antihistamines for allergy, cough and cold  certain antibiotics like clarithromycin, erythromycin, isoniazid, rifampin, rifapentine, rifabutin, and troleandomycin  certain medicines for blood pressure, heart disease, irregular heart beat  certain medicines for depression, like amitriptyline, fluoxetine, sertraline  certain medicines for seizures like carbamazepine, oxcarbazepine, phenobarbital, phenytoin, primidone  cimetidine  cyclosporine  female hormones, like estrogens or progestins and birth control pills, patches, rings, or injections  general anesthetics like halothane, isoflurane, methoxyflurane, propofol  grapefruit juice  local anesthetics like lidocaine, pramoxine, tetracaine  medicines that relax muscles for surgery  narcotic medicines for pain  other antiviral medicines for HIV or AIDS  phenothiazines like chlorpromazine, mesoridazine, prochlorperazine, thioridazine This list may not describe all possible interactions. Give your health care provider a list of all the medicines, herbs, non-prescription drugs, or dietary supplements you use. Also tell them if you smoke, drink alcohol, or use illegal drugs. Some items may interact with your medicine. What should I watch for while using this medicine? Tell your doctor or health care professional if your symptoms do not start to get better or if they get worse. Do not stop taking except on your doctor's advice. You may develop a severe reaction. Your doctor will tell you how much medicine to take. You  may get drowsy or dizzy. Do not drive, use machinery, or do anything that needs mental  alertness until you know how this medicine affects you. To reduce the risk of dizzy and fainting spells, do not stand or sit up quickly, especially if you are an older patient. Alcohol may increase dizziness and drowsiness. Avoid alcoholic drinks. If you are taking another medicine that also causes drowsiness, you may have more side effects. Give your health care provider a list of all medicines you use. Your doctor will tell you how much medicine to take. Do not take more medicine than directed. Call emergency for help if you have problems breathing or unusual sleepiness. What side effects may I notice from receiving this medicine? Side effects that you should report to your doctor or health care professional as soon as possible:  allergic reactions like skin rash, itching or hives, swelling of the face, lips, or tongue  breathing problems  confusion  loss of balance or coordination  signs and symptoms of low blood pressure like dizziness; feeling faint or lightheaded, falls; unusually weak or tired  suicidal thoughts or other mood changes Side effects that usually do not require medical attention (report to your doctor or health care professional if they continue or are bothersome):  dizziness  dry mouth  nausea, vomiting  tiredness This list may not describe all possible side effects. Call your doctor for medical advice about side effects. You may report side effects to FDA at 1-800-FDA-1088. Where should I keep my medicine? Keep out of the reach of children. This medicine can be abused. Keep your medicine in a safe place to protect it from theft. Do not share this medicine with anyone. Selling or giving away this medicine is dangerous and against the law. Store at room temperature between 20 and 25 degrees C (68 and 77 degrees F). This medicine may cause accidental overdose and death if taken by other adults, children, or pets. Mix any unused medicine with a substance like cat litter  or coffee grounds. Then throw the medicine away in a sealed container like a sealed bag or a coffee can with a lid. Do not use the medicine after the expiration date. NOTE: This sheet is a summary. It may not cover all possible information. If you have questions about this medicine, talk to your doctor, pharmacist, or health care provider.  2020 Elsevier/Gold Standard (2015-09-10 13:47:25)

## 2019-06-27 NOTE — Progress Notes (Signed)
Epworth Sleepiness Scale 0= would never doze 1= slight chance of dozing 2= moderate chance of dozing 3= high chance of dozing  Sitting and reading: 0 Watching TV: 3 Sitting inactive in a public place (ex. Theater or meeting): 3 As a passenger in a car for an hour without a break: 0 Lying down to rest in the afternoon: 3 Sitting and talking to someone: 0 Sitting quietly after lunch (no alcohol): 3 In a car, while stopped in traffic: 0 Total: 12

## 2019-06-29 LAB — CBC WITH DIFFERENTIAL/PLATELET
Basophils Absolute: 0.1 10*3/uL (ref 0.0–0.2)
Basos: 1 %
EOS (ABSOLUTE): 0.2 10*3/uL (ref 0.0–0.4)
Eos: 2 %
Hematocrit: 39 % (ref 34.0–46.6)
Hemoglobin: 12.8 g/dL (ref 11.1–15.9)
Immature Grans (Abs): 0 10*3/uL (ref 0.0–0.1)
Immature Granulocytes: 0 %
Lymphocytes Absolute: 3.1 10*3/uL (ref 0.7–3.1)
Lymphs: 36 %
MCH: 27.8 pg (ref 26.6–33.0)
MCHC: 32.8 g/dL (ref 31.5–35.7)
MCV: 85 fL (ref 79–97)
Monocytes Absolute: 0.6 10*3/uL (ref 0.1–0.9)
Monocytes: 7 %
Neutrophils Absolute: 4.7 10*3/uL (ref 1.4–7.0)
Neutrophils: 54 %
Platelets: 364 10*3/uL (ref 150–450)
RBC: 4.61 x10E6/uL (ref 3.77–5.28)
RDW: 13.9 % (ref 11.7–15.4)
WBC: 8.7 10*3/uL (ref 3.4–10.8)

## 2019-06-29 LAB — B12 AND FOLATE PANEL
Folate: 13.6 ng/mL (ref >3.0)
Vitamin B-12: 1420 pg/mL — ABNORMAL HIGH (ref 232–1245)

## 2019-06-29 LAB — COMPREHENSIVE METABOLIC PANEL
ALT: 32 IU/L (ref 0–32)
AST: 23 IU/L (ref 0–40)
Albumin/Globulin Ratio: 1.4 (ref 1.2–2.2)
Albumin: 4.1 g/dL (ref 3.8–4.9)
Alkaline Phosphatase: 170 IU/L — ABNORMAL HIGH (ref 39–117)
BUN/Creatinine Ratio: 12 (ref 9–23)
BUN: 10 mg/dL (ref 6–24)
Bilirubin Total: 0.2 mg/dL (ref 0.0–1.2)
CO2: 26 mmol/L (ref 20–29)
Calcium: 10 mg/dL (ref 8.7–10.2)
Chloride: 98 mmol/L (ref 96–106)
Creatinine, Ser: 0.83 mg/dL (ref 0.57–1.00)
GFR calc Af Amer: 91 mL/min/{1.73_m2} (ref >59)
GFR calc non Af Amer: 79 mL/min/{1.73_m2} (ref >59)
Globulin, Total: 3 g/dL (ref 1.5–4.5)
Glucose: 163 mg/dL — ABNORMAL HIGH (ref 65–99)
Potassium: 4.6 mmol/L (ref 3.5–5.2)
Sodium: 141 mmol/L (ref 134–144)
Total Protein: 7.1 g/dL (ref 6.0–8.5)

## 2019-06-29 LAB — METHYLMALONIC ACID, SERUM: Methylmalonic Acid: 120 nmol/L (ref 0–378)

## 2019-06-29 LAB — TSH: TSH: 0.338 u[IU]/mL — ABNORMAL LOW (ref 0.450–4.500)

## 2019-06-30 ENCOUNTER — Encounter: Payer: Self-pay | Admitting: Neurology

## 2019-06-30 DIAGNOSIS — G8929 Other chronic pain: Secondary | ICD-10-CM | POA: Insufficient documentation

## 2019-06-30 DIAGNOSIS — G43009 Migraine without aura, not intractable, without status migrainosus: Secondary | ICD-10-CM | POA: Insufficient documentation

## 2019-07-01 ENCOUNTER — Telehealth: Payer: Self-pay | Admitting: *Deleted

## 2019-07-01 NOTE — Telephone Encounter (Signed)
-----  Message from Melvenia Beam, MD sent at 06/30/2019  7:39 PM EDT ----- Nothing concerning on labs. Glucose elevated but she has known diabetes and is working with pcp on managing this. Alk phos is elevated, this can be seen in women who are overweight recommend weight management and following up with primary care. It can also be seen with gallbladder problems, for any abdominal pain needs to see primary care for evaluation. Thanks,

## 2019-07-01 NOTE — Telephone Encounter (Signed)
Called pt and LVM (ok per DPR) advising of lab results showing no concerns however glucose elevated and alkaline phosphatase elevated. I advised of the reasons why we can see an alk phos elevation and gave Dr. Cathren Laine recommendations and advice to follow up with PCP if she has any abdominal pain. Left office number for call back if she has any questions.

## 2019-07-02 ENCOUNTER — Telehealth: Payer: Self-pay | Admitting: Neurology

## 2019-07-02 ENCOUNTER — Other Ambulatory Visit: Payer: Self-pay | Admitting: *Deleted

## 2019-07-02 DIAGNOSIS — H93A9 Pulsatile tinnitus, unspecified ear: Secondary | ICD-10-CM

## 2019-07-02 DIAGNOSIS — G4484 Primary exertional headache: Secondary | ICD-10-CM

## 2019-07-02 DIAGNOSIS — H539 Unspecified visual disturbance: Secondary | ICD-10-CM

## 2019-07-02 DIAGNOSIS — G441 Vascular headache, not elsewhere classified: Secondary | ICD-10-CM

## 2019-07-02 DIAGNOSIS — R519 Headache, unspecified: Secondary | ICD-10-CM

## 2019-07-02 NOTE — Telephone Encounter (Signed)
Returned pt's call and LVM (ok per DPR) advising of lab results showing no concerns however glucose elevated and alkaline phosphatase elevated. I advised of the reasons why we can see an alk phos elevation and gave Dr. Cathren Laine recommendations and advice to follow up with PCP if she has any abdominal pain. Left office number for call back if she has any questions.

## 2019-07-02 NOTE — Telephone Encounter (Signed)
no to the covid-19 questions MR Brain w/wo contrast & MRA Head wo contrast Dr. Ihor Dow [facility] Auth: Section Ref # [id number] Schimke D on 07/02/19. Patient is scheduled at [facility] for 07/03/19.  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 When you get a chance can you put an xray in she is going to get it checked at GI before she comes to [facility] tomorrow.

## 2019-07-02 NOTE — Telephone Encounter (Signed)
Noted  

## 2019-07-02 NOTE — Telephone Encounter (Signed)
DG eye foreign body ordered.

## 2019-07-02 NOTE — Telephone Encounter (Signed)
Pt call back to state she miss her call from RN. Message sent to nurse to return call.

## 2019-07-03 ENCOUNTER — Other Ambulatory Visit: Payer: Self-pay

## 2019-07-03 ENCOUNTER — Ambulatory Visit: Payer: BC Managed Care – PPO

## 2019-07-03 ENCOUNTER — Telehealth: Payer: Self-pay | Admitting: *Deleted

## 2019-07-03 ENCOUNTER — Ambulatory Visit
Admission: RE | Admit: 2019-07-03 | Discharge: 2019-07-03 | Disposition: A | Discharge status: Home / Self Care | Payer: BC Managed Care – PPO | Source: Ambulatory Visit | Attending: Neurology | Admitting: Neurology

## 2019-07-03 DIAGNOSIS — G441 Vascular headache, not elsewhere classified: Secondary | ICD-10-CM

## 2019-07-03 DIAGNOSIS — R51 Headache with orthostatic component, not elsewhere classified: Secondary | ICD-10-CM

## 2019-07-03 DIAGNOSIS — H93A9 Pulsatile tinnitus, unspecified ear: Secondary | ICD-10-CM

## 2019-07-03 DIAGNOSIS — G4484 Primary exertional headache: Secondary | ICD-10-CM

## 2019-07-03 DIAGNOSIS — H539 Unspecified visual disturbance: Secondary | ICD-10-CM

## 2019-07-03 DIAGNOSIS — R519 Headache, unspecified: Secondary | ICD-10-CM

## 2019-07-03 DIAGNOSIS — Z135 Encounter for screening for eye and ear disorders: Secondary | ICD-10-CM | POA: Diagnosis not present

## 2019-07-03 MED ORDER — GADOBENATE DIMEGLUMINE 529 MG/ML IV SOLN
18.0000 mL | Freq: Once | INTRAVENOUS | Status: AC | PRN
  Administered 2019-07-03: 18 mL via INTRAVENOUS

## 2019-07-03 NOTE — Telephone Encounter (Signed)
-----   Message from Melvenia Beam, MD sent at 07/03/2019  1:00 PM EDT ----- No evidence of metallic foreign body within the orbits.

## 2019-07-03 NOTE — Telephone Encounter (Signed)
Spoke with pt and advised her of eye xray showing no evidence of any metallic foreign body in the eyes. She verbalized appreciation and understanding. She had no questions.

## 2019-07-04 ENCOUNTER — Telehealth: Payer: Self-pay | Admitting: *Deleted

## 2019-07-04 NOTE — Telephone Encounter (Signed)
Pt had signed a release form to [employer] at office visit which was sent to medical records. Pt wants labs, office visit sent to [employer] however she had not provided the fax number yet.   I called the patient. She stated she had called [employer] yesterday to have them send a request form to our office because she didn't have the fax number. She will reach out to them again today and call our office back with the number.

## 2019-07-09 ENCOUNTER — Telehealth: Payer: Self-pay | Admitting: *Deleted

## 2019-07-09 NOTE — Telephone Encounter (Addendum)
Called pt and advised of the MRI & MRA results below being unremarkable, no concern. Some mild atherosclerosis noted on MR angio of head. Pt verbalized understanding and appreciation. She had no questions at this time. Encouraged pt to call back if needed.    ----- Message from Melvenia Beam, MD sent at 07/09/2019 11:23 AM EDT ----- MR angio of the head is unremarkable, some atherosclerosis is there but mild. thanks  Notes recorded by Melvenia Beam, MD on 07/07/2019 at 8:00 PM EDT  MRI of the brain and MRA of the head are both unremarkable. Thank you

## 2019-07-15 ENCOUNTER — Institutional Professional Consult (permissible substitution): Payer: BC Managed Care – PPO | Admitting: Neurology

## 2019-07-15 ENCOUNTER — Telehealth: Payer: Self-pay | Admitting: Neurology

## 2019-07-15 NOTE — Telephone Encounter (Signed)
Patient called this morning and cancelled her sleep consult scheduled for 11 am. Pt will be a no show for appointment today

## 2019-07-16 ENCOUNTER — Encounter: Payer: Self-pay | Admitting: Neurology

## 2019-07-16 DIAGNOSIS — I10 Essential (primary) hypertension: Secondary | ICD-10-CM | POA: Diagnosis not present

## 2019-07-16 DIAGNOSIS — R928 Other abnormal and inconclusive findings on diagnostic imaging of breast: Secondary | ICD-10-CM | POA: Diagnosis not present

## 2019-07-16 DIAGNOSIS — R51 Headache: Secondary | ICD-10-CM | POA: Diagnosis not present

## 2019-07-16 DIAGNOSIS — M542 Cervicalgia: Secondary | ICD-10-CM | POA: Diagnosis not present

## 2019-08-01 DIAGNOSIS — R921 Mammographic calcification found on diagnostic imaging of breast: Secondary | ICD-10-CM | POA: Diagnosis not present

## 2019-08-01 DIAGNOSIS — R928 Other abnormal and inconclusive findings on diagnostic imaging of breast: Secondary | ICD-10-CM | POA: Diagnosis not present

## 2019-08-08 DIAGNOSIS — R51 Headache: Secondary | ICD-10-CM | POA: Diagnosis not present

## 2019-08-08 DIAGNOSIS — M542 Cervicalgia: Secondary | ICD-10-CM | POA: Diagnosis not present

## 2019-08-14 DIAGNOSIS — M542 Cervicalgia: Secondary | ICD-10-CM | POA: Diagnosis not present

## 2019-08-16 DIAGNOSIS — M4810 Ankylosing hyperostosis [Forestier], site unspecified: Secondary | ICD-10-CM | POA: Diagnosis not present

## 2019-08-16 DIAGNOSIS — M542 Cervicalgia: Secondary | ICD-10-CM | POA: Diagnosis not present

## 2019-08-20 DIAGNOSIS — E785 Hyperlipidemia, unspecified: Secondary | ICD-10-CM | POA: Diagnosis not present

## 2019-08-20 DIAGNOSIS — F329 Major depressive disorder, single episode, unspecified: Secondary | ICD-10-CM | POA: Diagnosis not present

## 2019-08-20 DIAGNOSIS — Z87891 Personal history of nicotine dependence: Secondary | ICD-10-CM | POA: Diagnosis not present

## 2019-08-20 DIAGNOSIS — E1169 Type 2 diabetes mellitus with other specified complication: Secondary | ICD-10-CM | POA: Diagnosis not present

## 2019-08-21 ENCOUNTER — Telehealth: Payer: Self-pay | Admitting: Neurology

## 2019-08-21 NOTE — Telephone Encounter (Signed)
Patient called and I scheduled her apt for Sept . Patient relayed she is still having a lot of HA.

## 2019-08-21 NOTE — Telephone Encounter (Signed)
We need to start medication she can follow up sooner with amy

## 2019-08-21 NOTE — Telephone Encounter (Signed)
I called the pt back and LVM asking for call back. Office number left in message. Advised pt I was calling to offer a sooner appt with Amy NP.

## 2019-09-16 ENCOUNTER — Ambulatory Visit: Payer: BC Managed Care – PPO | Admitting: Neurology

## 2019-09-23 ENCOUNTER — Telehealth: Payer: Self-pay

## 2019-09-23 ENCOUNTER — Ambulatory Visit: Payer: BC Managed Care – PPO | Admitting: Neurology

## 2019-09-23 NOTE — Telephone Encounter (Signed)
Patient no show for appt today. 

## 2019-09-24 ENCOUNTER — Encounter: Payer: Self-pay | Admitting: Neurology

## 2019-09-26 DIAGNOSIS — F329 Major depressive disorder, single episode, unspecified: Secondary | ICD-10-CM | POA: Diagnosis not present

## 2019-09-26 DIAGNOSIS — I10 Essential (primary) hypertension: Secondary | ICD-10-CM | POA: Diagnosis not present

## 2019-09-26 DIAGNOSIS — Z6831 Body mass index (BMI) 31.0-31.9, adult: Secondary | ICD-10-CM | POA: Diagnosis not present

## 2019-09-26 DIAGNOSIS — G43009 Migraine without aura, not intractable, without status migrainosus: Secondary | ICD-10-CM | POA: Diagnosis not present

## 2019-12-17 DIAGNOSIS — E785 Hyperlipidemia, unspecified: Secondary | ICD-10-CM | POA: Diagnosis not present

## 2019-12-17 DIAGNOSIS — F329 Major depressive disorder, single episode, unspecified: Secondary | ICD-10-CM | POA: Diagnosis not present

## 2019-12-17 DIAGNOSIS — I10 Essential (primary) hypertension: Secondary | ICD-10-CM | POA: Diagnosis not present

## 2019-12-17 DIAGNOSIS — E1169 Type 2 diabetes mellitus with other specified complication: Secondary | ICD-10-CM | POA: Diagnosis not present

## 2020-02-27 DIAGNOSIS — H6590 Unspecified nonsuppurative otitis media, unspecified ear: Secondary | ICD-10-CM | POA: Diagnosis not present

## 2020-02-27 DIAGNOSIS — E1169 Type 2 diabetes mellitus with other specified complication: Secondary | ICD-10-CM | POA: Diagnosis not present

## 2020-02-27 DIAGNOSIS — J329 Chronic sinusitis, unspecified: Secondary | ICD-10-CM | POA: Diagnosis not present

## 2020-02-27 DIAGNOSIS — E785 Hyperlipidemia, unspecified: Secondary | ICD-10-CM | POA: Diagnosis not present

## 2020-03-16 DIAGNOSIS — R928 Other abnormal and inconclusive findings on diagnostic imaging of breast: Secondary | ICD-10-CM | POA: Diagnosis not present

## 2020-03-16 DIAGNOSIS — R921 Mammographic calcification found on diagnostic imaging of breast: Secondary | ICD-10-CM | POA: Diagnosis not present

## 2020-03-25 DIAGNOSIS — E119 Type 2 diabetes mellitus without complications: Secondary | ICD-10-CM | POA: Diagnosis not present

## 2020-04-13 DIAGNOSIS — E1169 Type 2 diabetes mellitus with other specified complication: Secondary | ICD-10-CM | POA: Diagnosis not present

## 2020-04-13 DIAGNOSIS — E785 Hyperlipidemia, unspecified: Secondary | ICD-10-CM | POA: Diagnosis not present

## 2020-04-13 DIAGNOSIS — R519 Headache, unspecified: Secondary | ICD-10-CM | POA: Diagnosis not present

## 2020-04-13 DIAGNOSIS — Z0000 Encounter for general adult medical examination without abnormal findings: Secondary | ICD-10-CM | POA: Diagnosis not present

## 2020-05-20 DIAGNOSIS — N183 Chronic kidney disease, stage 3 unspecified: Secondary | ICD-10-CM | POA: Diagnosis not present

## 2020-05-20 DIAGNOSIS — E1169 Type 2 diabetes mellitus with other specified complication: Secondary | ICD-10-CM | POA: Diagnosis not present

## 2020-05-20 DIAGNOSIS — R21 Rash and other nonspecific skin eruption: Secondary | ICD-10-CM | POA: Diagnosis not present

## 2020-05-20 DIAGNOSIS — E785 Hyperlipidemia, unspecified: Secondary | ICD-10-CM | POA: Diagnosis not present

## 2020-05-29 DIAGNOSIS — Z86010 Personal history of colonic polyps: Secondary | ICD-10-CM | POA: Diagnosis not present

## 2020-05-29 DIAGNOSIS — Z1211 Encounter for screening for malignant neoplasm of colon: Secondary | ICD-10-CM | POA: Diagnosis not present

## 2020-05-29 DIAGNOSIS — K5730 Diverticulosis of large intestine without perforation or abscess without bleeding: Secondary | ICD-10-CM | POA: Diagnosis not present

## 2020-07-02 DIAGNOSIS — E785 Hyperlipidemia, unspecified: Secondary | ICD-10-CM | POA: Diagnosis not present

## 2020-07-02 DIAGNOSIS — F329 Major depressive disorder, single episode, unspecified: Secondary | ICD-10-CM | POA: Diagnosis not present

## 2020-07-02 DIAGNOSIS — E1169 Type 2 diabetes mellitus with other specified complication: Secondary | ICD-10-CM | POA: Diagnosis not present

## 2020-07-02 DIAGNOSIS — I10 Essential (primary) hypertension: Secondary | ICD-10-CM | POA: Diagnosis not present

## 2020-07-02 DIAGNOSIS — R7989 Other specified abnormal findings of blood chemistry: Secondary | ICD-10-CM | POA: Diagnosis not present

## 2020-07-29 ENCOUNTER — Other Ambulatory Visit: Payer: Self-pay

## 2020-07-29 ENCOUNTER — Other Ambulatory Visit: Payer: Self-pay | Admitting: Sports Medicine

## 2020-07-29 ENCOUNTER — Encounter: Payer: Self-pay | Admitting: Sports Medicine

## 2020-07-29 ENCOUNTER — Ambulatory Visit: Payer: BC Managed Care – PPO | Admitting: Sports Medicine

## 2020-07-29 DIAGNOSIS — M79672 Pain in left foot: Secondary | ICD-10-CM

## 2020-07-29 DIAGNOSIS — Q828 Other specified congenital malformations of skin: Secondary | ICD-10-CM | POA: Diagnosis not present

## 2020-07-29 DIAGNOSIS — M2142 Flat foot [pes planus] (acquired), left foot: Secondary | ICD-10-CM

## 2020-07-29 DIAGNOSIS — M2141 Flat foot [pes planus] (acquired), right foot: Secondary | ICD-10-CM

## 2020-07-29 DIAGNOSIS — M85679 Other cyst of bone, unspecified ankle and foot: Secondary | ICD-10-CM

## 2020-07-29 DIAGNOSIS — M21619 Bunion of unspecified foot: Secondary | ICD-10-CM

## 2020-07-29 DIAGNOSIS — E1165 Type 2 diabetes mellitus with hyperglycemia: Secondary | ICD-10-CM | POA: Diagnosis not present

## 2020-07-29 DIAGNOSIS — M79671 Pain in right foot: Secondary | ICD-10-CM

## 2020-07-29 DIAGNOSIS — E119 Type 2 diabetes mellitus without complications: Secondary | ICD-10-CM | POA: Diagnosis not present

## 2020-07-29 NOTE — Progress Notes (Signed)
Subjective: Cheryl Oneal is a 59 y.o. Diabetic female patient who presents to office for evaluation of right>left foot pain. Patient complains of progressive pain over the lesions to the bottoms of the feet, R>L worse at sub met 1 off and on for the last 2-3 years. Pain is worse after work, works 12 hour shifts and has tried wart removal cream OTC without relief. Patient denies any other pedal complaints.   FBS 176 couple of days ago  A1c 9 per patient   Patient Active Problem List   Diagnosis Date Noted  . Chronic intractable headache 06/30/2019  . Migraine without aura and without status migrainosus, not intractable 06/30/2019    Current Outpatient Medications on File Prior to Visit  Medication Sig Dispense Refill  . ALPRAZolam (XANAX) 0.25 MG tablet Take 1-2 tabs (0.28m-0.50mg) 30-60 minutes before procedure. May repeat if needed.Do not drive. 4 tablet 0  . amitriptyline (ELAVIL) 50 MG tablet Take 50 mg by mouth at bedtime.    .Marland KitchenamLODipine (NORVASC) 10 MG tablet Take 10 mg by mouth daily.    .Marland Kitchenaspirin 81 MG chewable tablet Chew 81 mg by mouth daily.    . ergocalciferol (VITAMIN D2) 1.25 MG (50000 UT) capsule Take 50,000 Units by mouth once a week.    . hydrochlorothiazide (HYDRODIURIL) 12.5 MG tablet Take 25 mg by mouth daily.    .Marland KitchenlinaGLIPtin-metFORMIN HCl (JENTADUETO PO) Take 5 mg by mouth daily.    . montelukast (SINGULAIR) 10 MG tablet Take 10 mg by mouth at bedtime.    . pravastatin (PRAVACHOL) 10 MG tablet Take 10 mg by mouth daily.     No current facility-administered medications on file prior to visit.    Not on File  Objective:  General: Alert and oriented x3 in no acute distress  Dermatology: Small hyperkeratotic lesion overlying 5 PIPJ dorsally on left foot with kertasis 4th webspace. No open lesions bilateral lower extremities, no webspace macerations, no ecchymosis bilateral, all nails x 10 are short and thickened mildly discolored but well manicured.  Vascular:  Dorsalis Pedis and Posterior Tibial pedal pulses 2/4, Capillary Fill Time 3 seconds,(+) pedal hair growth bilateral, no edema bilateral lower extremities, Temperature gradient within normal limits.  Neurology: Gross sensation intact via light touch bilateral, Protective sensation intact with Semmes Weinstein Monofilament to all pedal sites, vibratory intact bilateral, No babinski sign present bilateral. Subjective occassional numbness to feet when her back is hurting. Has history of bulging disc lumbar and cervical.   Musculoskeletal: Semi-flexible hammertoes 5 with varus rotation with mild tenderness with palpation at PIPJ Left. Ankle, Subtalar, Midtarsal, and MTPJ joint range of motion is within normal limits, there is no 1st ray hypermobility noted bilateral, Mild bunion deformity noted bilateral. No pain with calf compression bilateral.  Strength within normal limits in all groups bilateral.        Assessment and Plan: Problem List Items Addressed This Visit    None    Visit Diagnoses    Porokeratosis    -  Primary   Foot pain, bilateral       Diabetes mellitus without complication (HCC)       Pes planus of both feet       Bunion         -Complete examination performed -Discussed treatement options callus, painful bilateral worse on right -Discussed importance of daily foot inspection in the setting of diabetes -Mechanically debrided keratosis x 3 on right and x 1 on left foot using sterile  chisel blade without incident and applied salinocaine and bandaid -Advised patient that she will benefit from diabetic shoes and insoles; office to check benefits -Recommend daily skin emollients, gave foot miracle cream sample -Patient to return to office in 3 months or sooner if condition worsens.   Landis Martins, DPM

## 2020-09-22 IMAGING — CR ORBITS FOR FOREIGN BODY - 2 VIEW
2 series · 2 of 2 positions shown · non-contrast
Comparison: None.

CLINICAL DATA: Metal working/exposure; clearance prior to MRI

EXAM:
ORBITS FOR FOREIGN BODY - 2 VIEW

[w orbit pa (1 of 2)]
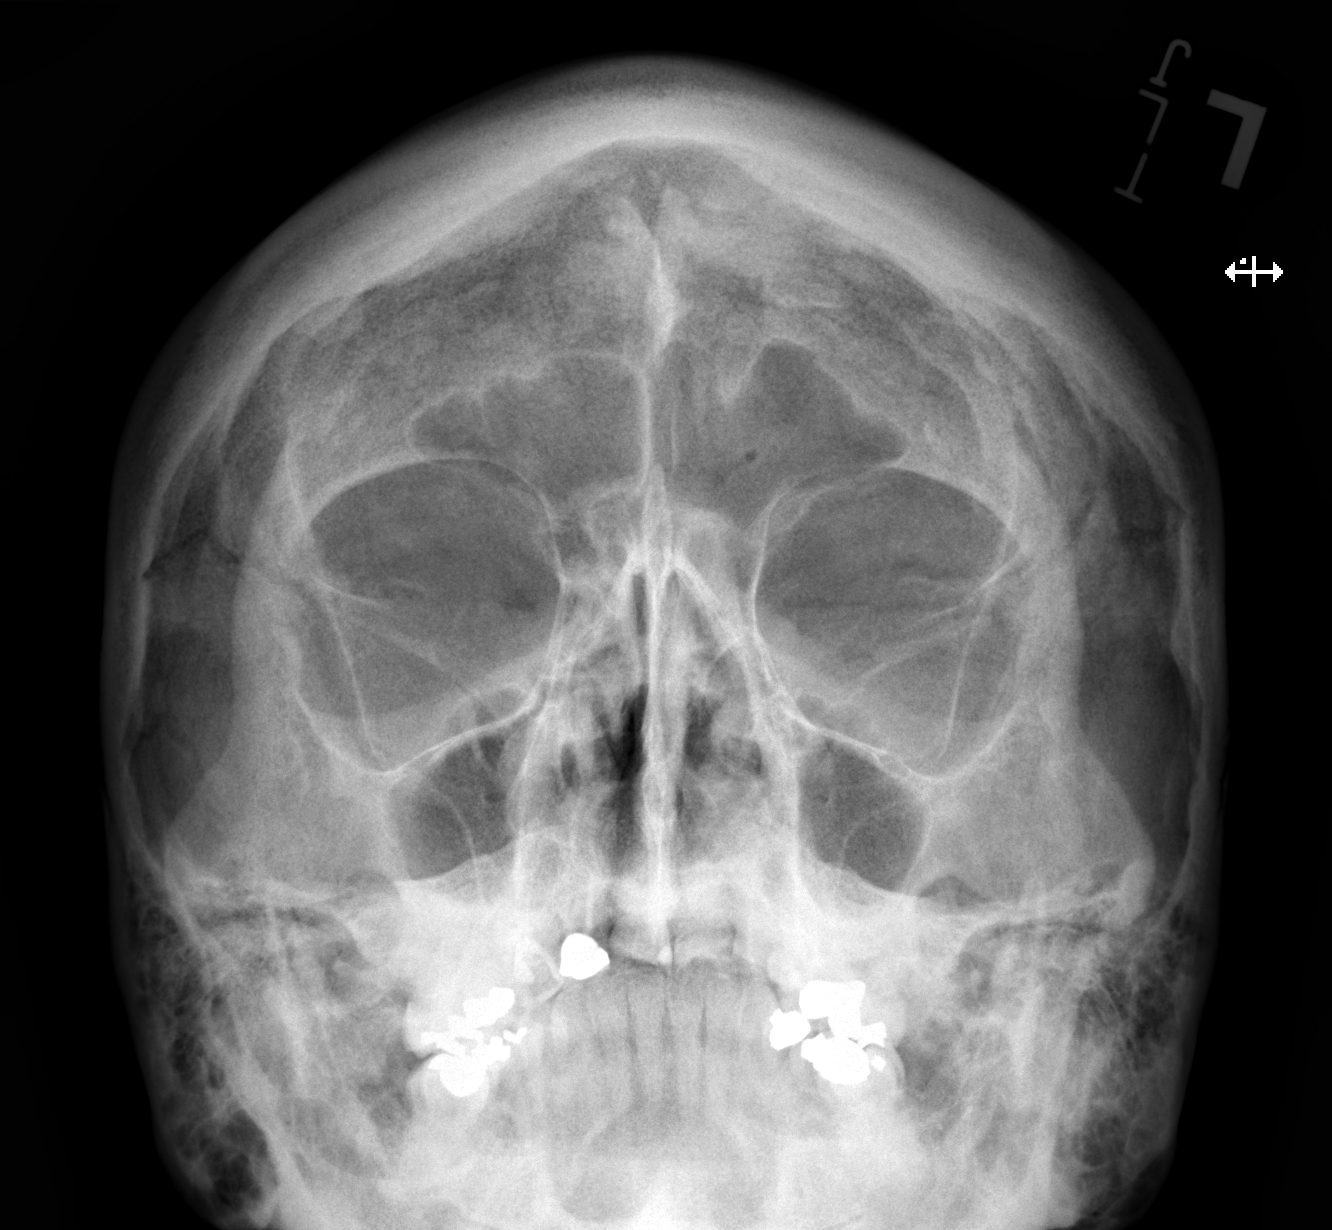

[w orbit pa (2 of 2)]
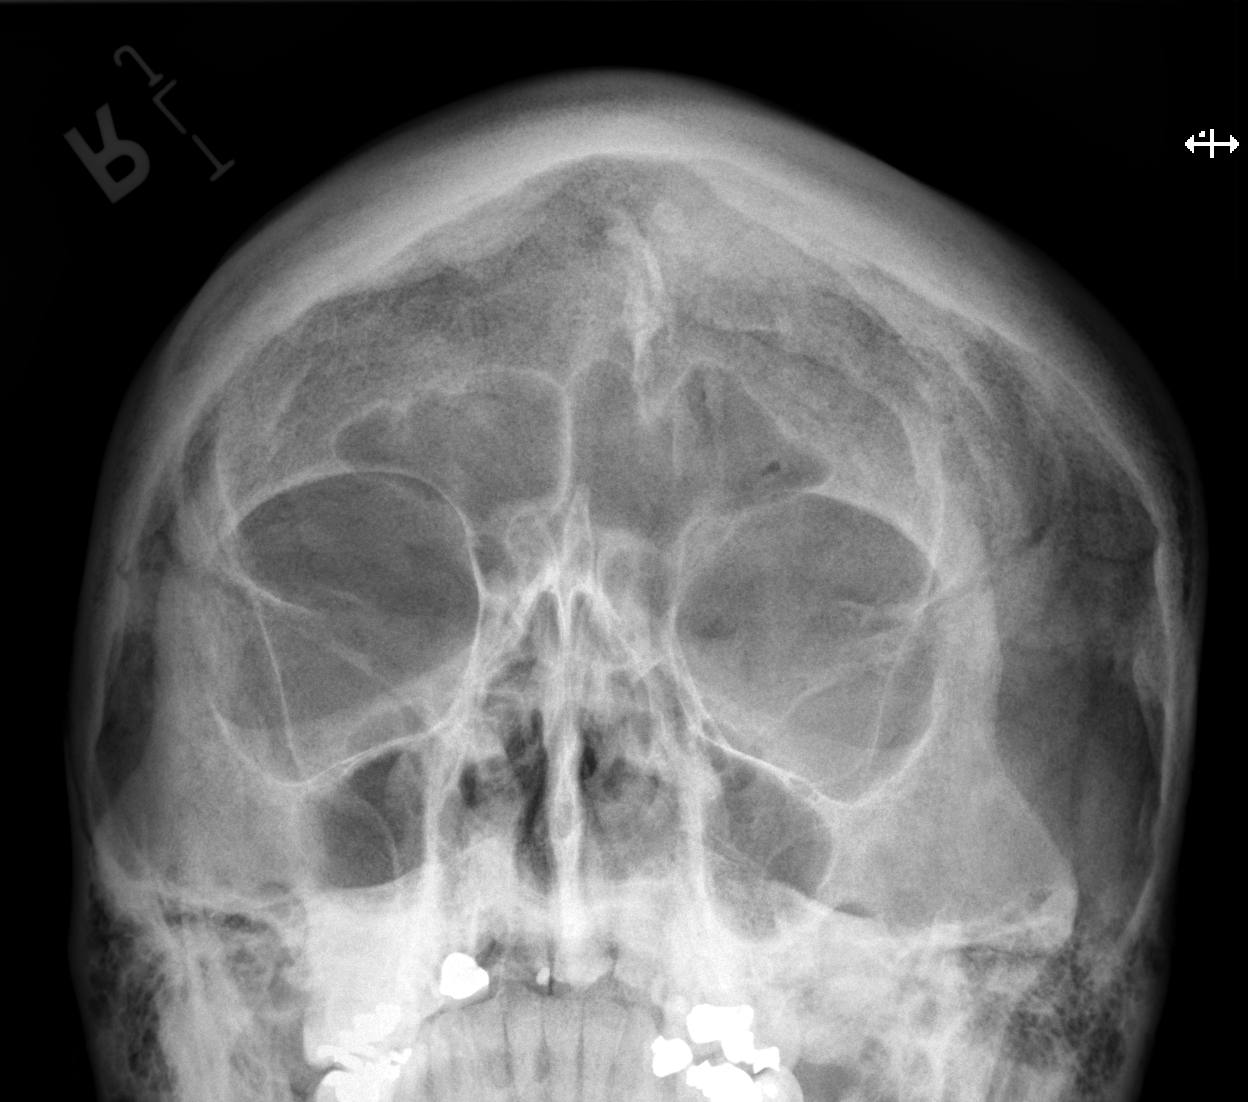

[2 of 2 positions shown; findings below may reference images not displayed]

FINDINGS: There is no evidence of metallic foreign body within the orbits. No
significant bone abnormality identified.
IMPRESSION: No evidence of metallic foreign body within the orbits.

## 2020-09-29 ENCOUNTER — Ambulatory Visit: Payer: BC Managed Care – PPO | Admitting: Sports Medicine

## 2020-10-21 DIAGNOSIS — R519 Headache, unspecified: Secondary | ICD-10-CM | POA: Diagnosis not present

## 2020-10-21 DIAGNOSIS — Z6831 Body mass index (BMI) 31.0-31.9, adult: Secondary | ICD-10-CM | POA: Diagnosis not present

## 2020-12-03 DIAGNOSIS — E785 Hyperlipidemia, unspecified: Secondary | ICD-10-CM | POA: Diagnosis not present

## 2020-12-03 DIAGNOSIS — R7989 Other specified abnormal findings of blood chemistry: Secondary | ICD-10-CM | POA: Diagnosis not present

## 2020-12-03 DIAGNOSIS — G43909 Migraine, unspecified, not intractable, without status migrainosus: Secondary | ICD-10-CM | POA: Diagnosis not present

## 2020-12-03 DIAGNOSIS — K219 Gastro-esophageal reflux disease without esophagitis: Secondary | ICD-10-CM | POA: Diagnosis not present

## 2020-12-03 DIAGNOSIS — F329 Major depressive disorder, single episode, unspecified: Secondary | ICD-10-CM | POA: Diagnosis not present

## 2020-12-03 DIAGNOSIS — E1169 Type 2 diabetes mellitus with other specified complication: Secondary | ICD-10-CM | POA: Diagnosis not present

## 2020-12-03 DIAGNOSIS — I10 Essential (primary) hypertension: Secondary | ICD-10-CM | POA: Diagnosis not present

## 2021-01-15 DIAGNOSIS — R519 Headache, unspecified: Secondary | ICD-10-CM | POA: Diagnosis not present

## 2021-01-15 DIAGNOSIS — Z20822 Contact with and (suspected) exposure to covid-19: Secondary | ICD-10-CM | POA: Diagnosis not present

## 2021-01-15 DIAGNOSIS — R42 Dizziness and giddiness: Secondary | ICD-10-CM | POA: Diagnosis not present

## 2021-03-29 DIAGNOSIS — E119 Type 2 diabetes mellitus without complications: Secondary | ICD-10-CM | POA: Diagnosis not present

## 2021-05-20 ENCOUNTER — Telehealth: Payer: Self-pay | Admitting: *Deleted

## 2021-05-20 NOTE — Telephone Encounter (Signed)
Patient is calling and wants to know how to take care of her calluses, is using a super crack cream prn and it seems to help some. Returned call to patient and explained that she needs to do a follow up with physician to re evaluate since it's not been in almost a year. She verbalized understanding.Please schedule.

## 2021-06-16 DIAGNOSIS — Z0000 Encounter for general adult medical examination without abnormal findings: Secondary | ICD-10-CM | POA: Diagnosis not present

## 2021-06-16 DIAGNOSIS — Z6830 Body mass index (BMI) 30.0-30.9, adult: Secondary | ICD-10-CM | POA: Diagnosis not present

## 2021-07-09 DIAGNOSIS — R1084 Generalized abdominal pain: Secondary | ICD-10-CM | POA: Diagnosis not present

## 2021-07-29 DIAGNOSIS — N6311 Unspecified lump in the right breast, upper outer quadrant: Secondary | ICD-10-CM | POA: Diagnosis not present

## 2021-07-29 DIAGNOSIS — R1084 Generalized abdominal pain: Secondary | ICD-10-CM | POA: Diagnosis not present

## 2021-07-29 DIAGNOSIS — R922 Inconclusive mammogram: Secondary | ICD-10-CM | POA: Diagnosis not present

## 2021-07-29 DIAGNOSIS — R109 Unspecified abdominal pain: Secondary | ICD-10-CM | POA: Diagnosis not present

## 2021-07-29 DIAGNOSIS — R921 Mammographic calcification found on diagnostic imaging of breast: Secondary | ICD-10-CM | POA: Diagnosis not present

## 2021-10-01 DIAGNOSIS — E113212 Type 2 diabetes mellitus with mild nonproliferative diabetic retinopathy with macular edema, left eye: Secondary | ICD-10-CM | POA: Diagnosis not present

## 2021-12-01 DIAGNOSIS — R7989 Other specified abnormal findings of blood chemistry: Secondary | ICD-10-CM | POA: Diagnosis not present

## 2021-12-01 DIAGNOSIS — E1169 Type 2 diabetes mellitus with other specified complication: Secondary | ICD-10-CM | POA: Diagnosis not present

## 2021-12-01 DIAGNOSIS — E785 Hyperlipidemia, unspecified: Secondary | ICD-10-CM | POA: Diagnosis not present

## 2021-12-01 DIAGNOSIS — F329 Major depressive disorder, single episode, unspecified: Secondary | ICD-10-CM | POA: Diagnosis not present

## 2021-12-01 DIAGNOSIS — G43909 Migraine, unspecified, not intractable, without status migrainosus: Secondary | ICD-10-CM | POA: Diagnosis not present

## 2021-12-01 DIAGNOSIS — Z23 Encounter for immunization: Secondary | ICD-10-CM | POA: Diagnosis not present

## 2021-12-01 DIAGNOSIS — I10 Essential (primary) hypertension: Secondary | ICD-10-CM | POA: Diagnosis not present

## 2021-12-01 DIAGNOSIS — K219 Gastro-esophageal reflux disease without esophagitis: Secondary | ICD-10-CM | POA: Diagnosis not present

## 2021-12-02 DIAGNOSIS — E119 Type 2 diabetes mellitus without complications: Secondary | ICD-10-CM | POA: Diagnosis not present

## 2022-01-12 DIAGNOSIS — E1165 Type 2 diabetes mellitus with hyperglycemia: Secondary | ICD-10-CM | POA: Diagnosis not present

## 2022-01-12 DIAGNOSIS — G629 Polyneuropathy, unspecified: Secondary | ICD-10-CM | POA: Diagnosis not present

## 2022-01-12 DIAGNOSIS — N183 Chronic kidney disease, stage 3 unspecified: Secondary | ICD-10-CM | POA: Diagnosis not present

## 2022-01-12 DIAGNOSIS — Z87891 Personal history of nicotine dependence: Secondary | ICD-10-CM | POA: Diagnosis not present

## 2022-01-26 DIAGNOSIS — I10 Essential (primary) hypertension: Secondary | ICD-10-CM | POA: Diagnosis not present

## 2022-01-26 DIAGNOSIS — R7989 Other specified abnormal findings of blood chemistry: Secondary | ICD-10-CM | POA: Diagnosis not present

## 2022-01-26 DIAGNOSIS — K219 Gastro-esophageal reflux disease without esophagitis: Secondary | ICD-10-CM | POA: Diagnosis not present

## 2022-01-26 DIAGNOSIS — E785 Hyperlipidemia, unspecified: Secondary | ICD-10-CM | POA: Diagnosis not present

## 2022-01-26 DIAGNOSIS — G43909 Migraine, unspecified, not intractable, without status migrainosus: Secondary | ICD-10-CM | POA: Diagnosis not present

## 2022-01-26 DIAGNOSIS — E1169 Type 2 diabetes mellitus with other specified complication: Secondary | ICD-10-CM | POA: Diagnosis not present

## 2022-01-26 DIAGNOSIS — F329 Major depressive disorder, single episode, unspecified: Secondary | ICD-10-CM | POA: Diagnosis not present

## 2022-01-26 LAB — LIPID PANEL
HDL: 35 (ref 35–70)
LDL Cholesterol: 42
Triglycerides: 245 — AB (ref 40–160)

## 2022-01-26 LAB — TSH: TSH: 0.96 (ref 0.41–5.90)

## 2022-02-09 ENCOUNTER — Ambulatory Visit: Payer: BC Managed Care – PPO | Admitting: Endocrinology

## 2022-03-04 ENCOUNTER — Encounter: Payer: Self-pay | Admitting: Endocrinology

## 2022-03-04 ENCOUNTER — Other Ambulatory Visit: Payer: Self-pay

## 2022-03-04 ENCOUNTER — Ambulatory Visit: Payer: BC Managed Care – PPO | Admitting: Endocrinology

## 2022-03-04 VITALS — BP 126/82 | HR 76 | Ht 65.0 in | Wt 189.6 lb

## 2022-03-04 DIAGNOSIS — E11319 Type 2 diabetes mellitus with unspecified diabetic retinopathy without macular edema: Secondary | ICD-10-CM

## 2022-03-04 DIAGNOSIS — E139 Other specified diabetes mellitus without complications: Secondary | ICD-10-CM

## 2022-03-04 LAB — POCT GLYCOSYLATED HEMOGLOBIN (HGB A1C): Hemoglobin A1C: 10.3 % — AB (ref 4.0–5.6)

## 2022-03-04 MED ORDER — LINAGLIPTIN 5 MG PO TABS: 5.0000 mg | ORAL_TABLET | Freq: Every day | ORAL | 3 refills | Status: DC

## 2022-03-04 MED ORDER — METFORMIN HCL ER 500 MG PO TB24: 1000.0000 mg | ORAL_TABLET | Freq: Every day | ORAL | 3 refills | Status: DC

## 2022-03-04 MED ORDER — GLIMEPIRIDE 4 MG PO TABS: 4.0000 mg | ORAL_TABLET | Freq: Every day | ORAL | 3 refills | Status: DC

## 2022-03-04 NOTE — Patient Instructions (Addendum)
good diet and exercise significantly improve the control of your diabetes.  please let me know if you wish to be referred to a dietician.  high blood sugar is very risky to your health.  you should see an eye doctor and dentist every year.  It is very important to get all recommended vaccinations.   ?Controlling your blood pressure and cholesterol drastically reduces the damage diabetes does to your body.  Those who smoke should quit.  Please discuss these with your doctor.  ?check your blood sugar once a day.  vary the time of day when you check, between before the 3 meals, and at bedtime.  also check if you have symptoms of your blood sugar being too high or too low.  please keep a record of the readings and bring it to your next appointment here (or you can bring the meter itself).  You can write it on any piece of paper.  please call us sooner if your blood sugar goes below 70, or if most of your readings are over 200.   ?We will need to take this complex situation in stages.   ?For now, please change the Jentadueto to 2 different meds, and:  ?I have sent a prescription to your pharmacy, to add glimepiride.   ?Please call or message [place] next week, to tell us how the blood sugar is doing.   ?There are other meds we can add if necessary.   ?Please come back for a follow-up appointment in 1 month.   ? ? ? ? ?

## 2022-03-04 NOTE — Progress Notes (Signed)
? ?Subjective:  ? ? Patient ID: Cheryl Oneal, female    DOB: 1961/04/08, 61 y.o.   MRN: [id number] ? ?HPI ?pt is referred by Graylon Gunning, PA, for diabetes.  Pt states DM was dx'ed in 2007; it is complicated by PN and DR; she has never been on insulin; pt says his diet and exercise are fair; she has never had GDM, pancreatitis, pancreatic surgery, severe hypoglycemia or DKA.  She takes Warden/ranger.  She says cbg varies from 146-400.  She declines insulin.    ?Past Medical History:  ?Diagnosis Date  ? Diabetes (HCC)   ? High blood pressure   ? High cholesterol   ? ? ?Past Surgical History:  ?Procedure Laterality Date  ? CESAREAN SECTION  1989  ? CESAREAN SECTION  1987  ? ? ?Social History  ? ?Socioeconomic History  ? Marital status: Single  ?  Spouse name: Not on file  ? Number of children: 3  ? Years of education: Not on file  ? Highest education level: Some college, no degree  ?Occupational History  ? Not on file  ?Tobacco Use  ? Smoking status: Some Days  ?  Packs/day: 1.50  ?  Types: Cigarettes  ? Smokeless tobacco: Never  ?Vaping Use  ? Vaping Use: Never used  ?Substance and Sexual Activity  ? Alcohol use: Never  ? Drug use: Never  ? Sexual activity: Not on file  ?Other Topics Concern  ? Not on file  ?Social History Narrative  ? Lives at home with daughter & son  ? Right handed  ? ?Social Determinants of Health  ? ?Financial Resource Strain: Not on file  ?Food Insecurity: Not on file  ?Transportation Needs: Not on file  ?Physical Activity: Not on file  ?Stress: Not on file  ?Social Connections: Not on file  ?Intimate Partner Violence: Not on file  ? ? ?Current Outpatient Medications on File Prior to Visit  ?Medication Sig Dispense Refill  ? ALPRAZolam (XANAX) 0.25 MG tablet Take 1-2 tabs (0.25mg -0.50mg ) 30-60 minutes before procedure. May repeat if needed.Do not drive. 4 tablet 0  ? amitriptyline (ELAVIL) 50 MG tablet Take 50 mg by mouth at bedtime.    ? amLODipine (NORVASC) 10 MG tablet Take 10 mg by mouth  daily.    ? aspirin 81 MG chewable tablet Chew 81 mg by mouth daily.    ? ergocalciferol (VITAMIN D2) 1.25 MG (50000 UT) capsule Take 50,000 Units by mouth once a week.    ? hydrochlorothiazide (HYDRODIURIL) 12.5 MG tablet Take 25 mg by mouth daily.    ? montelukast (SINGULAIR) 10 MG tablet Take 10 mg by mouth at bedtime.    ? pravastatin (PRAVACHOL) 10 MG tablet Take 10 mg by mouth daily.    ? ?No current facility-administered medications on file prior to visit.  ? ? ?Not on File ? ?Family History  ?Problem Relation Age of Onset  ? Heart failure Mother   ? Diabetes Father   ? High blood pressure Other   ?     on both sides of family  ? Diabetes Other   ?     on both sides of family  ? ? ?BP 126/82   Pulse 76   Ht 5\' 5"  (1.651 m)   Wt 189 lb 9.6 oz (86 kg)   LMP  (LMP Unknown)   SpO2 95%   BMI 31.55 kg/m?  ? ? ? ?Review of Systems ?denies weight loss, n/v, memory loss.  She has  HB.   ?   ?Objective:  ? Physical Exam ?VITAL SIGNS:  See vs page ?GENERAL: no distress ?Pulses: dorsalis pedis intact bilat.   ?MSK: no deformity of the feet ?CV: trace bilat leg edema.   ?Skin:  no ulcer on the feet.  normal color and temp on the feet.  ?Neuro: sensation is intact to touch on the feet.  ? ?Lab Results  ?Component Value Date  ? HGBA1C 10.3 (A) 03/04/2022  ? ?I have reviewed outside records, and summarized: ?Pt was noted to have elevated A1c, and referred here.  Multiple other med probs were addressed, including HTN and dyslipidemia ? ?   ?Assessment & Plan:  ?Type 2 DM: uncontrolled ? ?Patient Instructions  ?good diet and exercise significantly improve the control of your diabetes.  please let me know if you wish to be referred to a dietician.  high blood sugar is very risky to your health.  you should see an eye doctor and dentist every year.  It is very important to get all recommended vaccinations.   ?Controlling your blood pressure and cholesterol drastically reduces the damage diabetes does to your body.  Those  who smoke should quit.  Please discuss these with your doctor.  ?check your blood sugar once a day.  vary the time of day when you check, between before the 3 meals, and at bedtime.  also check if you have symptoms of your blood sugar being too high or too low.  please keep a record of the readings and bring it to your next appointment here (or you can bring the meter itself).  You can write it on any piece of paper.  please call us sooner if your blood sugar goes below 70, or if most of your readings are over 200.   ?We will need to take this complex situation in stages.   ?For now, please change the Jentadueto to 2 different meds, and:  ?I have sent a prescription to your pharmacy, to add glimepiride.   ?Please call or message [place] next week, to tell us how the blood sugar is doing.   ?There are other meds we can add if necessary.   ?Please come back for a follow-up appointment in 1 month.   ? ? ? ? ? ? ?

## 2022-03-05 DIAGNOSIS — E1165 Type 2 diabetes mellitus with hyperglycemia: Secondary | ICD-10-CM | POA: Insufficient documentation

## 2022-03-05 DIAGNOSIS — E119 Type 2 diabetes mellitus without complications: Secondary | ICD-10-CM | POA: Insufficient documentation

## 2022-03-24 ENCOUNTER — Ambulatory Visit: Payer: BC Managed Care – PPO | Admitting: Endocrinology

## 2022-04-26 DIAGNOSIS — I10 Essential (primary) hypertension: Secondary | ICD-10-CM | POA: Diagnosis not present

## 2022-04-26 DIAGNOSIS — E785 Hyperlipidemia, unspecified: Secondary | ICD-10-CM | POA: Diagnosis not present

## 2022-04-26 DIAGNOSIS — F329 Major depressive disorder, single episode, unspecified: Secondary | ICD-10-CM | POA: Diagnosis not present

## 2022-04-26 DIAGNOSIS — G43909 Migraine, unspecified, not intractable, without status migrainosus: Secondary | ICD-10-CM | POA: Diagnosis not present

## 2022-04-26 DIAGNOSIS — E1169 Type 2 diabetes mellitus with other specified complication: Secondary | ICD-10-CM | POA: Diagnosis not present

## 2022-04-26 LAB — HEMOGLOBIN A1C: Hemoglobin A1C: 10.1

## 2022-04-26 LAB — BASIC METABOLIC PANEL: Creatinine: 0.9 (ref <=1.1)

## 2022-05-04 ENCOUNTER — Ambulatory Visit: Payer: BC Managed Care – PPO | Admitting: Endocrinology

## 2022-05-19 ENCOUNTER — Encounter: Payer: BC Managed Care – PPO | Attending: Endocrinology | Admitting: Dietician

## 2022-05-19 ENCOUNTER — Encounter: Payer: Self-pay | Admitting: Dietician

## 2022-05-19 DIAGNOSIS — E11319 Type 2 diabetes mellitus with unspecified diabetic retinopathy without macular edema: Secondary | ICD-10-CM | POA: Diagnosis not present

## 2022-05-19 NOTE — Progress Notes (Addendum)
Diabetes Self-Management Education  Visit Type: First/Initial  Appt. Start Time: 1045 Appt. End Time: 1200  05/19/2022  [name], identified by name and date of birth, is a 61 y.o. female with a diagnosis of Diabetes: Type 2.   ASSESSMENT Patient is here today alone.  She states that her work now allows 6 visits per year. Referral for Type 2 Diabetes. She would like to learn how to stop eating when satisfied and reduce cravings.  History includes:  Type 2 Diabetes, HTN, HLD A1C 10.3% 03/04/2022 Medications include:  glimepiride, metformin, linagliptin, farxiga, vitamin D Sleep:  "Good when she sleeps 5 hours"      Patient and daughter live together.  She does the shopping and cooking. 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  She stands all the time at work.  Works first shift.  Gets three 20 minute breaks. Walks dogs and does yoga.  Weight 194 lb (88 kg). Body mass index is 32.28 kg/m.   Diabetes Self-Management Education - 05/19/22 1100       Visit Information   Visit Type First/Initial      Initial Visit   Diabetes Type Type 2    Are you currently following a meal plan? No    Are you taking your medications as prescribed? Yes      Health Coping   How would you rate your overall health? Fair      Psychosocial Assessment   Patient Belief/Attitude about Diabetes Motivated to manage diabetes    What is the hardest part about your diabetes right now, causing you the most concern, or is the most worrisome to you about your diabetes?   Checking blood sugar;Other (comment)   "I don't like to check my sugar because the number's won't come down."   Self-care barriers None    Self-management support Doctor's office    Other persons present Patient    Patient Concerns Nutrition/Meal planning;Problem Solving;Glycemic Control    Special Needs None    Preferred Learning Style No preference indicated    Learning Readiness Ready    How often  do you need to have someone help you when you read instructions, pamphlets, or other written materials from your doctor or pharmacy? 1 - Never    What is the last grade level you completed in school? some college      Pre-Education Assessment   Patient understands the diabetes disease and treatment process. Needs Instruction    Patient understands incorporating nutritional management into lifestyle. Needs Instruction    Patient undertands incorporating physical activity into lifestyle. Needs Instruction    Patient understands using medications safely. Needs Instruction    Patient understands monitoring blood glucose, interpreting and using results Needs Instruction    Patient understands prevention, detection, and treatment of acute complications. Needs Instruction    Patient understands prevention, detection, and treatment of chronic complications. Needs Instruction    Patient understands how to develop strategies to address psychosocial issues. Needs Instruction    Patient understands how to develop strategies to promote health/change behavior. Needs Instruction      Complications   Last HgB A1C per patient/outside source 10.9 %   03/04/2022   How often do you check your blood sugar? 0 times/day (not testing)   rare as she is depressed about the numbers   Number of hyperglycemic episodes ( >200mg /dL): Daily    Can you tell when your blood sugar is high? No  Have you had a dilated eye exam in the past 12 months? No   appointment in June   Have you had a dental exam in the past 12 months? No      Dietary Intake   Breakfast coffee (3-4 cups) with 2 splenda and non dairy creamer per cup    Snack (morning) honey oat granola bar, fruit    Snack (afternoon) honey oat granola bar, fruit    Dinner vegetable, meat, starch    Snack (evening) popcorn, trail mix, banana nut muffin and occasional lactose free ice cream    Beverage(s) Diet Mt. Dew      Activity / Exercise   Activity / Exercise  Type Light (walking / raking leaves)   walks dogs and yoga   How many days per week do you exercise? 4    How many minutes per day do you exercise? 30    Total minutes per week of exercise 120      Patient Education   Previous Diabetes Education No    Healthy Eating Meal options for control of blood glucose level and chronic complications.;Plate Method;Role of diet in the treatment of diabetes and the relationship between the three main macronutrients and blood glucose level    Being Active Role of exercise on diabetes management, blood pressure control and cardiac health.    Medications Reviewed patients medication for diabetes, action, purpose, timing of dose and side effects.    Monitoring Identified appropriate SMBG and/or A1C goals.;Purpose and frequency of SMBG.;Daily foot exams;Yearly dilated eye exam    Acute complications Discussed and identified patients' prevention, symptoms, and treatment of hyperglycemia.;Taught prevention, symptoms, and  treatment of hypoglycemia - the 15 rule.    Chronic complications Relationship between chronic complications and blood glucose control;Retinopathy and reason for yearly dilated eye exams    Diabetes Stress and Support Worked with patient to identify barriers to care and solutions;Role of stress on diabetes      Individualized Goals (developed by patient)   Nutrition General guidelines for healthy choices and portions discussed    Physical Activity Exercise 5-7 days per week;45 minutes per day    Medications take my medication as prescribed    Monitoring  Test my blood glucose as discussed    Problem Solving Addressing barriers to behavior change;Eating Pattern    Reducing Risk examine blood glucose patterns;treat hypoglycemia with 15 grams of carbs if blood glucose less than 70mg /dL;do foot checks daily      Post-Education Assessment   Patient understands the diabetes disease and treatment process. Demonstrates understanding / competency     Patient understands incorporating nutritional management into lifestyle. Comprehends key points    Patient undertands incorporating physical activity into lifestyle. Demonstrates understanding / competency    Patient understands using medications safely. Demonstrates understanding / competency    Patient understands monitoring blood glucose, interpreting and using results Demonstrates understanding / competency    Patient understands prevention, detection, and treatment of acute complications. Demonstrates understanding / competency    Patient understands prevention, detection, and treatment of chronic complications. Demonstrates understanding / competency    Patient understands how to develop strategies to address psychosocial issues. Comprehends key points    Patient understands how to develop strategies to promote health/change behavior. Needs Review      Outcomes   Expected Outcomes Demonstrated interest in learning. Expect positive outcomes    Future DMSE 2 months    Program Status Not Completed  Individualized Plan for Diabetes Self-Management Training:   Learning Objective:  Patient will have a greater understanding of diabetes self-management. Patient education plan is to attend individual and/or group sessions per assessed needs and concerns.   Plan:   Patient Instructions  Plan:  Aim for 2-3 Carb Choices per meal (30-45 grams) +/- 1 either way  Aim for 0-1-2 Carbs per snack if hungry  Include protein in moderation with your meals and snacks Consider reading food labels for Total Carbohydrate of foods Consider  increasing your activity level by walking for 30 minutes daily as tolerated Consider checking BG at alternate times per day  Continue taking medication as directed by MD    Expected Outcomes:  Demonstrated interest in learning. Expect positive outcomes  Education material provided: ADA - How to Thrive: A Guide for Your Journey with Diabetes, Food  label handouts, Meal plan card, Snack sheet, and Diabetes Resources  If problems or questions, patient to contact team via:  Phone  Future DSME appointment: 2 months

## 2022-05-19 NOTE — Patient Instructions (Signed)
Plan:  Aim for 2-3 Carb Choices per meal (30-45 grams) +/- 1 either way  Aim for 0-1-2 Carbs per snack if hungry  Include protein in moderation with your meals and snacks Consider reading food labels for Total Carbohydrate of foods Consider  increasing your activity level by walking for 30 minutes daily as tolerated Consider checking BG at alternate times per day  Continue taking medication as directed by MD

## 2022-06-01 ENCOUNTER — Ambulatory Visit: Payer: BC Managed Care – PPO | Admitting: Endocrinology

## 2022-06-14 DIAGNOSIS — M549 Dorsalgia, unspecified: Secondary | ICD-10-CM | POA: Diagnosis not present

## 2022-06-21 ENCOUNTER — Ambulatory Visit: Payer: BC Managed Care – PPO | Admitting: Endocrinology

## 2022-06-21 ENCOUNTER — Encounter: Payer: Self-pay | Admitting: Endocrinology

## 2022-06-21 VITALS — BP 138/76 | HR 71 | Ht 65.0 in | Wt 196.8 lb

## 2022-06-21 DIAGNOSIS — E1165 Type 2 diabetes mellitus with hyperglycemia: Secondary | ICD-10-CM | POA: Diagnosis not present

## 2022-06-21 MED ORDER — XIGDUO XR 5-1000 MG PO TB24: 2.0000 | ORAL_TABLET | Freq: Every day | ORAL | 1 refills | Status: DC

## 2022-06-21 MED ORDER — TIRZEPATIDE 5 MG/0.5ML ~~LOC~~ SOAJ: 5.0000 mg | SUBCUTANEOUS | 1 refills | Status: DC

## 2022-07-08 DIAGNOSIS — M62830 Muscle spasm of back: Secondary | ICD-10-CM | POA: Diagnosis not present

## 2022-07-08 DIAGNOSIS — M546 Pain in thoracic spine: Secondary | ICD-10-CM | POA: Diagnosis not present

## 2022-07-19 ENCOUNTER — Other Ambulatory Visit: Payer: Self-pay

## 2022-07-19 DIAGNOSIS — E1165 Type 2 diabetes mellitus with hyperglycemia: Secondary | ICD-10-CM

## 2022-07-19 MED ORDER — TIRZEPATIDE 5 MG/0.5ML ~~LOC~~ SOAJ: SUBCUTANEOUS | 1 refills | Status: DC

## 2022-08-05 DIAGNOSIS — M546 Pain in thoracic spine: Secondary | ICD-10-CM | POA: Diagnosis not present

## 2022-08-05 DIAGNOSIS — R202 Paresthesia of skin: Secondary | ICD-10-CM | POA: Diagnosis not present

## 2022-08-05 DIAGNOSIS — M62830 Muscle spasm of back: Secondary | ICD-10-CM | POA: Diagnosis not present

## 2022-08-24 DIAGNOSIS — Z6831 Body mass index (BMI) 31.0-31.9, adult: Secondary | ICD-10-CM | POA: Diagnosis not present

## 2022-08-24 DIAGNOSIS — Z0000 Encounter for general adult medical examination without abnormal findings: Secondary | ICD-10-CM | POA: Diagnosis not present

## 2022-08-24 DIAGNOSIS — E1169 Type 2 diabetes mellitus with other specified complication: Secondary | ICD-10-CM | POA: Diagnosis not present

## 2022-09-06 ENCOUNTER — Other Ambulatory Visit: Payer: Self-pay | Admitting: Endocrinology

## 2022-09-06 DIAGNOSIS — E1165 Type 2 diabetes mellitus with hyperglycemia: Secondary | ICD-10-CM

## 2022-09-08 ENCOUNTER — Encounter: Payer: Self-pay | Admitting: Endocrinology

## 2022-09-08 ENCOUNTER — Ambulatory Visit: Payer: BC Managed Care – PPO | Admitting: Endocrinology

## 2022-09-08 VITALS — BP 148/82 | HR 83 | Ht 65.0 in | Wt 189.6 lb

## 2022-09-08 DIAGNOSIS — E1165 Type 2 diabetes mellitus with hyperglycemia: Secondary | ICD-10-CM

## 2022-09-08 MED ORDER — XIGDUO XR 5-1000 MG PO TB24: 2.0000 | ORAL_TABLET | Freq: Every day | ORAL | 1 refills | Status: DC

## 2022-09-08 MED ORDER — TIRZEPATIDE 7.5 MG/0.5ML ~~LOC~~ SOAJ: 7.5000 mg | SUBCUTANEOUS | 3 refills | Status: DC

## 2022-09-08 NOTE — Progress Notes (Signed)
Patient ID: Cheryl Oneal, female   DOB: [birth year], 61 y.o.   MRN: [id number]           Reason for Appointment: Type II Diabetes follow-up   History of Present Illness   Diagnosis date: 2007  Previous history:  Non-insulin hypoglycemic drugs previously used: Metformin, glipizide, Farxiga, Tradjenta Insulin was never started   A1c history not available from previous PCPs  Recent history:     Non-insulin hypoglycemic drugs: MOUNJARO 5 mg weekly, metformin ER 1500 mg daily, glimepiride 4 mg daily, Farxiga 5 mg daily        Side effects from medications: Possibly constipation from metformin higher dose  Current self management, blood sugar patterns and problems identified:  A1c is 8.7 done by PCP, previously 10.1 She again did not bring her monitor for download On her last visit she was started on [facility] and she is taking 5 mg weekly since 07/19/2022 No nausea with this She has some better satiety and has lost 7 pounds  Also her blood sugars are looking much better She has also started checking occasional readings after meals but only twice but after supper which were relatively higher  Also no side effects from increasing her metformin to 3 tablets daily She has not switched from [place] to [place] as yet  She has been doing some walking more recently but not consistently because of back pain  No hypoglycemia  Diet management: Breakfast usually at 9 am at work     Hypoglycemia:  none    Glucometer: One Touch?.           Blood Glucose readings from recall   PRE-MEAL Fasting Lunch Dinner Bedtime Overall  Glucose range: 86-125 130   86-201  Mean/median:        POST-MEAL PC Breakfast PC Lunch PC Dinner  Glucose range:   188, 201  Mean/median:       Dietician visit: Most recent: 5/23      Weight control:  Wt Readings from Last 3 Encounters:  09/08/22 189 lb 9.6 oz (86 kg)  06/21/22 196 lb 12.8 oz (89.3 kg)  05/19/22 194 lb (88 kg)            Diabetes  labs:  Lab Results  Component Value Date   HGBA1C 10.10 04/26/2022   HGBA1C 10.3 (A) 03/04/2022   Lab Results  Component Value Date   LDLCALC 42 01/26/2022   CREATININE 0.9 04/26/2022     Allergies as of 09/08/2022   Not on File      Medication List        Accurate as of September 08, 2022  2:04 PM. If you have any questions, ask your nurse or doctor.          STOP taking these medications    Mounjaro 5 MG/0.5ML Pen Generic drug: tirzepatide Replaced by: tirzepatide 7.5 MG/0.5ML Pen Stopped by: Reather Littler, MD       TAKE these medications    amitriptyline 50 MG tablet Commonly known as: ELAVIL Take 50 mg by mouth at bedtime.   amLODipine 10 MG tablet Commonly known as: NORVASC Take 10 mg by mouth daily.   aspirin 81 MG chewable tablet Chew 81 mg by mouth daily.   ergocalciferol 1.25 MG (50000 UT) capsule Commonly known as: VITAMIN D2 Take 50,000 Units by mouth once a week.   gabapentin 300 MG capsule Commonly known as: NEURONTIN Take by mouth.   glimepiride 4 MG tablet Commonly known as: AMARYL  Take 1 tablet (4 mg total) by mouth daily before breakfast.   hydrochlorothiazide 12.5 MG tablet Commonly known as: HYDRODIURIL Take 25 mg by mouth daily.   methocarbamol 500 MG tablet Commonly known as: ROBAXIN Take 500 mg by mouth 2 (two) times daily as needed.   metoprolol succinate 100 MG 24 hr tablet Commonly known as: TOPROL-XL Take 100 mg by mouth daily.   montelukast 10 MG tablet Commonly known as: SINGULAIR Take 10 mg by mouth at bedtime.   pravastatin 10 MG tablet Commonly known as: PRAVACHOL Take 10 mg by mouth daily.   tirzepatide 7.5 MG/0.5ML Pen Commonly known as: MOUNJARO Inject 7.5 mg into the skin once a week. Replaces: Mounjaro 5 MG/0.5ML Pen Started by: Reather Littler, MD   Xigduo XR 04-999 MG Tb24 Generic drug: Dapagliflozin-metFORMIN HCl ER Take 2 tablets by mouth daily.        Allergies: Not on File  Past  Medical History:  Diagnosis Date   Diabetes (HCC)    High blood pressure    High cholesterol     Past Surgical History:  Procedure Laterality Date   CESAREAN SECTION  1989   CESAREAN SECTION  1987    Family History  Problem Relation Age of Onset   Heart failure Mother    Diabetes Father    High blood pressure Other        on both sides of family   Diabetes Other        on both sides of family    Social History:  reports that she has been smoking cigarettes. She has been smoking an average of 1.5 packs per day. She has never used smokeless tobacco. She reports that she does not drink alcohol and does not use drugs.  Review of Systems:  Last diabetic eye exam date 4/22  Last foot exam date:12/22  Symptoms of neuropathy: None  Hypertension:   Treated by PCP with Norvasc, HCTZ and metoprolol Pressure recently was better with PCP  BP Readings from Last 3 Encounters:  09/08/22 (!) 148/82  06/21/22 138/76  03/04/22 126/82    Lipid management: Done with 10 mg pravastatin by PCP Most recent triglycerides were 210    No results found for: "CHOL" Lab Results  Component Value Date   HDL 35 01/26/2022   Lab Results  Component Value Date   LDLCALC 42 01/26/2022   Lab Results  Component Value Date   TRIG 245 (A) 01/26/2022   No results found for: "CHOLHDL" No results found for: "LDLDIRECT"   Examination:   BP (!) 148/82   Pulse 83   Ht 5\' 5"  (1.651 m)   Wt 189 lb 9.6 oz (86 kg)   LMP  (LMP Unknown)   SpO2 94%   BMI 31.55 kg/m   Body mass index is 31.55 kg/m.    ASSESSMENT/ PLAN:    Diabetes type 2 non-insulin-dependent:   Current regimen: Metformin, Amaryl, Mounjaro and Farxiga  See history of present illness for detailed discussion of current diabetes management, blood sugar patterns and problems identified  A1c is now below 10% for the first time and 8.7 recently done by PCP  She is doing much better with using Mounjaro instead of  Tradjenta Also has lost weight Likely of her blood sugars are checked consistently after meals and whether they are consistently controlled  Blood glucose control is suboptimal and persistently poorly controlled, may have some insulin deficiency with having had diabetes since 2007 Blood sugar monitoring is suboptimal  and did not bring a monitor for review also Although she appears to have benefited from Trulicity in the past this has not been tried because of cost recently  Recommendations made today:  Start checking blood sugars after meals consistently and bring monitor for download on each visit  Switch Farxiga and metformin to XIGDUO 04/999, 2 tablets daily for better efficacy with higher dose of metformin  Increase Mounjaro to 7.5 Reduce Amaryl to 2 mg and stop if blood sugar is low  Patient Instructions  Check blood sugars on waking up 3 days a week  Also check blood sugars about 2 hours after meals and do this after different meals by rotation  Recommended blood sugar levels on waking up are 90-130 and about 2 hours after meal is 130-180  Please bring your blood sugar monitor to each visit, thank you  Cut Glimeperide   Reather Littler 09/08/2022, 2:04 PM   Total visit time for evaluation and management and counseling = 30 minutes

## 2022-09-08 NOTE — Patient Instructions (Signed)
Check blood sugars on waking up 3 days a week  Also check blood sugars about 2 hours after meals and do this after different meals by rotation  Recommended blood sugar levels on waking up are 90-130 and about 2 hours after meal is 130-180  Please bring your blood sugar monitor to each visit, thank you  Cut Glimeperide

## 2022-09-21 ENCOUNTER — Ambulatory Visit: Payer: BC Managed Care – PPO | Admitting: Podiatry

## 2022-10-10 ENCOUNTER — Ambulatory Visit: Payer: BC Managed Care – PPO | Admitting: Podiatry

## 2022-10-10 DIAGNOSIS — M79674 Pain in right toe(s): Secondary | ICD-10-CM

## 2022-10-10 DIAGNOSIS — M79675 Pain in left toe(s): Secondary | ICD-10-CM

## 2022-10-10 DIAGNOSIS — E1142 Type 2 diabetes mellitus with diabetic polyneuropathy: Secondary | ICD-10-CM

## 2022-10-10 DIAGNOSIS — L84 Corns and callosities: Secondary | ICD-10-CM

## 2022-10-10 DIAGNOSIS — B351 Tinea unguium: Secondary | ICD-10-CM

## 2022-10-10 NOTE — Progress Notes (Signed)
  Subjective:  Patient ID: Cheryl Oneal, female    DOB: 11/29/1961,  MRN: [id number]  Chief Complaint  Patient presents with   Diabetes    A1C down to 8 from 10.10 - high risk foot care   Callouses    Painful callus lesions right foot   Nail Problem    Thick painful toenails    61 y.o. female presents with the above complaint. History confirmed with patient. Patient presenting with pain related to dystrophic thickened elongated nails. Patient is unable to trim own nails related to nail dystrophy and/or mobility issues. Patient does have a history of T2DM with peripheral neuropathy. Patient does/does not have callus present located at the plantar aspect of the great toe joint on the right foot as well as the right heel causing pain.   Objective:  Physical Exam: warm, good capillary refill, DP and PT pulses 2/4 bilateral nail exam onychomycosis of the toenails, onycholysis, and dystrophic nails DP pulses palpable, PT pulses palpable, and protective sensation absent Left Foot:  Pain with palpation of nails due to elongation and dystrophic growth.  Pes planus deformity noted Right Foot: Pain with palpation of nails due to elongation and dystrophic growth.  Pes planus deformity noted.  Hyperkeratotic lesion present at the plantar aspect of the first metatarsal head on the right foot.  No underlying ulceration present upon debridement of this area.  Additional hyperkeratotic lesion with central, packed core at the plantar medial tubercle of the calcaneus.  Assessment:   1. Pre-ulcerative calluses   2. Pain due to onychomycosis of toenails of both feet   3. DM type 2 with diabetic peripheral neuropathy ([place])      Plan:  Patient was evaluated and treated and all questions answered.  #Hyperkeratotic lesions/pre ulcerative calluses present plantar aspect of the first metatarsal head right foot as well as the plantar medial heel right foot All symptomatic hyperkeratoses x 2 separate lesions  were safely debrided with a sterile #10 blade to patient's level of comfort without incident. We discussed preventative and palliative care of these lesions including supportive and accommodative shoegear, padding, prefabricated and custom molded accommodative orthoses, use of a pumice stone and lotions/creams daily.  #Onychomycosis with pain  -Nails palliatively debrided as below. -Educated on self-care  Procedure: Nail Debridement Rationale: Pain Type of Debridement: manual, sharp debridement. Instrumentation: Nail nipper, rotary burr. Number of Nails: 10  Return in about 3 months (around 01/10/2023) for [facility].         Everitt Amber, DPM [facility]

## 2022-10-10 NOTE — Patient Instructions (Signed)
Call insurance to check coverage for custom orthotics, Code [phone]

## 2022-10-20 DIAGNOSIS — E119 Type 2 diabetes mellitus without complications: Secondary | ICD-10-CM | POA: Diagnosis not present

## 2022-11-21 ENCOUNTER — Telehealth: Payer: Self-pay | Admitting: Neurology

## 2022-11-21 ENCOUNTER — Institutional Professional Consult (permissible substitution): Payer: BC Managed Care – PPO | Admitting: Neurology

## 2022-11-21 ENCOUNTER — Encounter: Payer: Self-pay | Admitting: Neurology

## 2022-11-21 NOTE — Telephone Encounter (Signed)
Patient no showed today for her new patient appointment for hand pain. She also has no showed multiple times in the past in our practice. Call referring patient offie and tell them to send her to the hand center for her hand pain since she no showed.  Angie she no showed today and multiple times in 2020. 33% no show rate. Anything we can do about that?

## 2022-11-25 ENCOUNTER — Other Ambulatory Visit: Payer: Self-pay | Admitting: Endocrinology

## 2022-12-01 DIAGNOSIS — E785 Hyperlipidemia, unspecified: Secondary | ICD-10-CM | POA: Diagnosis not present

## 2022-12-01 DIAGNOSIS — E1169 Type 2 diabetes mellitus with other specified complication: Secondary | ICD-10-CM | POA: Diagnosis not present

## 2022-12-01 DIAGNOSIS — I10 Essential (primary) hypertension: Secondary | ICD-10-CM | POA: Diagnosis not present

## 2022-12-01 DIAGNOSIS — F329 Major depressive disorder, single episode, unspecified: Secondary | ICD-10-CM | POA: Diagnosis not present

## 2022-12-13 ENCOUNTER — Ambulatory Visit: Payer: BC Managed Care – PPO | Admitting: Endocrinology

## 2022-12-13 NOTE — Progress Notes (Deleted)
Patient ID: KIMBERLE STANFILL, female   DOB: [birth year], 61 y.o.   MRN: [id number]           Reason for Appointment: Type II Diabetes follow-up   History of Present Illness   Diagnosis date: 2007  Previous history:  Non-insulin hypoglycemic drugs previously used: Metformin, glipizide, Farxiga, Tradjenta Insulin was never started   A1c history not available from previous PCPs  Recent history:     Non-insulin hypoglycemic drugs: MOUNJARO 5 mg weekly, metformin ER 1500 mg daily, glimepiride 4 mg daily, Farxiga 5 mg daily        Side effects from medications: Possibly constipation from metformin higher dose  Current self management, blood sugar patterns and problems identified:  A1c is 8.7 done by PCP, previously 10.1 She again did not bring her monitor for download On her last visit she was started on [facility] and she is taking 5 mg weekly since 07/19/2022 No nausea with this She has some better satiety and has lost 7 pounds  Also her blood sugars are looking much better She has also started checking occasional readings after meals but only twice but after supper which were relatively higher  Also no side effects from increasing her metformin to 3 tablets daily She has not switched from [place] to [place] as yet  She has been doing some walking more recently but not consistently because of back pain  No hypoglycemia  Diet management: Breakfast usually at 9 am at work     Hypoglycemia:  none    Glucometer: One Touch?.           Blood Glucose readings from recall   PRE-MEAL Fasting Lunch Dinner Bedtime Overall  Glucose range: 86-125 130   86-201  Mean/median:        POST-MEAL PC Breakfast PC Lunch PC Dinner  Glucose range:   188, 201  Mean/median:       Dietician visit: Most recent: 5/23      Weight control:  Wt Readings from Last 3 Encounters:  09/08/22 189 lb 9.6 oz (86 kg)  06/21/22 196 lb 12.8 oz (89.3 kg)  05/19/22 194 lb (88 kg)            Diabetes  labs:  Lab Results  Component Value Date   HGBA1C 10.10 04/26/2022   HGBA1C 10.3 (A) 03/04/2022   Lab Results  Component Value Date   LDLCALC 42 01/26/2022   CREATININE 0.9 04/26/2022     Allergies as of 09/08/2022   Not on File      Medication List        Accurate as of September 08, 2022  2:04 PM. If you have any questions, ask your nurse or doctor.          STOP taking these medications    Mounjaro 5 MG/0.5ML Pen Generic drug: tirzepatide Replaced by: tirzepatide 7.5 MG/0.5ML Pen Stopped by: Reather Littler, MD       TAKE these medications    amitriptyline 50 MG tablet Commonly known as: ELAVIL Take 50 mg by mouth at bedtime.   amLODipine 10 MG tablet Commonly known as: NORVASC Take 10 mg by mouth daily.   aspirin 81 MG chewable tablet Chew 81 mg by mouth daily.   ergocalciferol 1.25 MG (50000 UT) capsule Commonly known as: VITAMIN D2 Take 50,000 Units by mouth once a week.   gabapentin 300 MG capsule Commonly known as: NEURONTIN Take by mouth.   glimepiride 4 MG tablet Commonly known as: AMARYL  Take 1 tablet (4 mg total) by mouth daily before breakfast.   hydrochlorothiazide 12.5 MG tablet Commonly known as: HYDRODIURIL Take 25 mg by mouth daily.   methocarbamol 500 MG tablet Commonly known as: ROBAXIN Take 500 mg by mouth 2 (two) times daily as needed.   metoprolol succinate 100 MG 24 hr tablet Commonly known as: TOPROL-XL Take 100 mg by mouth daily.   montelukast 10 MG tablet Commonly known as: SINGULAIR Take 10 mg by mouth at bedtime.   pravastatin 10 MG tablet Commonly known as: PRAVACHOL Take 10 mg by mouth daily.   tirzepatide 7.5 MG/0.5ML Pen Commonly known as: MOUNJARO Inject 7.5 mg into the skin once a week. Replaces: Mounjaro 5 MG/0.5ML Pen Started by: Reather Littler, MD   Xigduo XR 04-999 MG Tb24 Generic drug: Dapagliflozin-metFORMIN HCl ER Take 2 tablets by mouth daily.        Allergies: Not on File  Past  Medical History:  Diagnosis Date   Diabetes (HCC)    High blood pressure    High cholesterol     Past Surgical History:  Procedure Laterality Date   CESAREAN SECTION  1989   CESAREAN SECTION  1987    Family History  Problem Relation Age of Onset   Heart failure Mother    Diabetes Father    High blood pressure Other        on both sides of family   Diabetes Other        on both sides of family    Social History:  reports that she has been smoking cigarettes. She has been smoking an average of 1.5 packs per day. She has never used smokeless tobacco. She reports that she does not drink alcohol and does not use drugs.  Review of Systems:  Last diabetic eye exam date 4/22  Last foot exam date:12/22  Symptoms of neuropathy: None  Hypertension:   Treated by PCP with Norvasc, HCTZ and metoprolol Pressure recently was better with PCP  BP Readings from Last 3 Encounters:  09/08/22 (!) 148/82  06/21/22 138/76  03/04/22 126/82    Lipid management: Done with 10 mg pravastatin by PCP Most recent triglycerides were 210    No results found for: "CHOL" Lab Results  Component Value Date   HDL 35 01/26/2022   Lab Results  Component Value Date   LDLCALC 42 01/26/2022   Lab Results  Component Value Date   TRIG 245 (A) 01/26/2022   No results found for: "CHOLHDL" No results found for: "LDLDIRECT"   Examination:   BP (!) 148/82   Pulse 83   Ht 5\' 5"  (1.651 m)   Wt 189 lb 9.6 oz (86 kg)   LMP  (LMP Unknown)   SpO2 94%   BMI 31.55 kg/m   Body mass index is 31.55 kg/m.    ASSESSMENT/ PLAN:    Diabetes type 2 non-insulin-dependent:   Current regimen: Metformin, Amaryl, Mounjaro and Farxiga  See history of present illness for detailed discussion of current diabetes management, blood sugar patterns and problems identified  A1c is now below 10% for the first time and 8.7 recently done by PCP  She is doing much better with using Mounjaro instead of  Tradjenta Also has lost weight Likely of her blood sugars are checked consistently after meals and whether they are consistently controlled  Blood glucose control is suboptimal and persistently poorly controlled, may have some insulin deficiency with having had diabetes since 2007 Blood sugar monitoring is suboptimal  and did not bring a monitor for review also Although she appears to have benefited from Trulicity in the past this has not been tried because of cost recently  Recommendations made today:  Start checking blood sugars after meals consistently and bring monitor for download on each visit  Switch Farxiga and metformin to XIGDUO 04/999, 2 tablets daily for better efficacy with higher dose of metformin  Increase Mounjaro to 7.5 Reduce Amaryl to 2 mg and stop if blood sugar is low  Patient Instructions  Check blood sugars on waking up 3 days a week  Also check blood sugars about 2 hours after meals and do this after different meals by rotation  Recommended blood sugar levels on waking up are 90-130 and about 2 hours after meal is 130-180  Please bring your blood sugar monitor to each visit, thank you  Cut Glimeperide   Bonny Egger 09/08/2022, 2:04 PM   Total visit time for evaluation and management and counseling = 30 minutes 

## 2022-12-15 DIAGNOSIS — Z1231 Encounter for screening mammogram for malignant neoplasm of breast: Secondary | ICD-10-CM | POA: Diagnosis not present

## 2022-12-20 ENCOUNTER — Other Ambulatory Visit: Payer: Self-pay | Admitting: Endocrinology

## 2023-01-04 ENCOUNTER — Telehealth: Payer: Self-pay

## 2023-01-04 NOTE — Telephone Encounter (Signed)
error 

## 2023-01-14 ENCOUNTER — Other Ambulatory Visit: Payer: Self-pay | Admitting: Endocrinology

## 2023-02-01 ENCOUNTER — Ambulatory Visit: Payer: Worker's Compensation | Admitting: Internal Medicine

## 2023-02-01 ENCOUNTER — Encounter: Payer: Self-pay | Admitting: Internal Medicine

## 2023-02-01 VITALS — BP 124/68 | HR 76 | Temp 97.2°F | Resp 16 | Ht 65.0 in

## 2023-02-01 DIAGNOSIS — S72492A Other fracture of lower end of left femur, initial encounter for closed fracture: Secondary | ICD-10-CM | POA: Diagnosis not present

## 2023-02-01 DIAGNOSIS — M25562 Pain in left knee: Secondary | ICD-10-CM | POA: Diagnosis not present

## 2023-02-01 DIAGNOSIS — S72302A Unspecified fracture of shaft of left femur, initial encounter for closed fracture: Secondary | ICD-10-CM | POA: Diagnosis not present

## 2023-02-01 MED ORDER — METHYLPREDNISOLONE 4 MG PO TBPK: ORAL_TABLET | ORAL | 0 refills | Status: DC

## 2023-02-01 NOTE — Progress Notes (Addendum)
Office Visit  Subjective   Patient ID: Cheryl Oneal   DOB: 09-12-1961   Age: 62 y.o.   MRN: [id number]   Chief Complaint Chief Complaint  Patient presents with   WORKERS COMP    Pt fell at 6:55am this morning and hurt left knee; [employer]     History of Present Illness The patient is a 62 yo female who comes in today for a workmen's compensation visit where she was injured at work this morning on 02/01/2023.  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  She was in her normal state of health when she was walking to her machine where there were 2 people in her way.  She tried to walk around them and tripped on a skid and fell to the floor.  This occurred around 7AM.  She states she fell and landed on her left knee first and then landed on her hands.  She did not strike her head and there was no LOC.  She states she has some minor pain in her left knee and she had no pain in her hands/wrists/arms.  The patient had some initial difficulty getting up but when she did she noted some pain in her left knee and swelling.  She "hobbled" back to the break room where her supervisor was notified.  They gave her an ice pack and arranged for her to come to our office for evaluation.  The patient states 4 years ago she fell at work and broke the knee cap on her right knee.  She has not had any problems in the past with her left knee.  She states she is having difficulty walking.  She states she has severe pain when she tries to put weight on that left leg after her fall this morning.  I am told by human resources that she has had multiple falls and the patient states she has had 3 falls over the last 3 years but denies any problems with balance.     Past Medical History Past Medical History:  Diagnosis Date   Diabetes ([place])    High blood pressure    High cholesterol      Allergies No  Known Allergies   Medications  Current Outpatient Medications:    methylPREDNISolone (MEDROL DOSEPAK) 4 MG TBPK tablet, Take as directed., Disp: 21 tablet, Rfl: 0   amitriptyline (ELAVIL) 50 MG tablet, Take 50 mg by mouth at bedtime., Disp: , Rfl:    amLODipine (NORVASC) 10 MG tablet, Take 10 mg by mouth daily., Disp: , Rfl:    aspirin 81 MG chewable tablet, Chew 81 mg by mouth daily., Disp: , Rfl:    ergocalciferol (VITAMIN D2) 1.25 MG (50000 UT) capsule, Take 50,000 Units by mouth once a week., Disp: , Rfl:    glimepiride (AMARYL) 4 MG tablet, Take 1 tablet (4 mg total) by mouth daily before breakfast., Disp: 90 tablet, Rfl: 3   hydrochlorothiazide (HYDRODIURIL) 12.5 MG tablet, Take 25 mg by mouth daily., Disp: , Rfl:    metoprolol succinate (TOPROL-XL) 100 MG 24 hr tablet, Take 100 mg by mouth daily. (Patient not taking: Reported on 09/08/2022), Disp: , Rfl:    MOUNJARO 5 MG/0.5ML Pen, INJECT ONE DOSE ONCE A WEEK, Disp: 4 mL, Rfl: 0   pravastatin (PRAVACHOL) 10 MG tablet, Take 10 mg by mouth daily., Disp: ,  Rfl:    tirzepatide (MOUNJARO) 7.5 MG/0.5ML Pen, Inject 7.5 mg into the skin once a week., Disp: 2 mL, Rfl: 3   Review of Systems Review of Systems  Constitutional:  Negative for chills and fever.  Eyes:  Negative for blurred vision and double vision.  Respiratory:  Negative for cough and shortness of breath.   Cardiovascular:  Negative for chest pain, palpitations and leg swelling.  Gastrointestinal:  Negative for abdominal pain, constipation, diarrhea, nausea and vomiting.  Musculoskeletal:  Negative for back pain, myalgias and neck pain.       Knee pain and swelling  Neurological:  Negative for dizziness, weakness and headaches.       Objective:    Vitals BP 124/68 (BP Location: Left Arm, Patient Position: Sitting, Cuff Size: Normal)   Pulse 76   Temp (!) 97.2 F (36.2 C) (Temporal)   Resp 16   Ht 5\' 5"  (1.651 m)   LMP  (LMP Unknown)   SpO2 98%   BMI 31.55 kg/m     Physical Examination Physical Exam Constitutional:      Appearance: Normal appearance. She is not ill-appearing.  Cardiovascular:     Rate and Rhythm: Normal rate and regular rhythm.     Pulses: Normal pulses.     Heart sounds: No murmur heard.    No friction rub. No gallop.  Pulmonary:     Effort: Pulmonary effort is normal. No respiratory distress.     Breath sounds: No wheezing, rhonchi or rales.  Abdominal:     General: Bowel sounds are normal. There is no distension.     Palpations: Abdomen is soft.     Tenderness: There is no abdominal tenderness.  Musculoskeletal:     Right lower leg: No edema.     Left lower leg: No edema.     Comments: She has pain over her left knee cap and upon extension of her leg.  Her ROM is normal.  She has very minimal swelling of the knee.  Skin:    General: Skin is warm and dry.     Findings: No rash.  Neurological:     Mental Status: She is alert.        Assessment & Plan:   Acute pain of left knee On exam, she has pain upon movement of her knee especially with extending her leg.  She has a bit of deformity of her left knee cap but she [place] and it has looked like that since that fall.  I am going to obtain a xray of her left knee.  The patient states she has some CKD that her PCP had told her.  We will start her on a medrol dose pak.  I want her to ice this area and we will write her for some crutches and obtain a xray of her left knee.    No follow-ups on file.   Townsend Roger, MD

## 2023-02-01 NOTE — Assessment & Plan Note (Signed)
On exam, she has pain upon movement of her knee especially with extending her leg.  She has a bit of deformity of her left knee cap but she [place] and it has looked like that since that fall.  I am going to obtain a xray of her left knee.  The patient states she has some CKD that her PCP had told her.  We will start her on a medrol dose pak.  I want her to ice this area and we will write her for some crutches and obtain a xray of her left knee.

## 2023-02-12 ENCOUNTER — Other Ambulatory Visit: Payer: Self-pay | Admitting: Endocrinology

## 2023-02-13 ENCOUNTER — Telehealth: Payer: Self-pay

## 2023-02-13 MED ORDER — TIRZEPATIDE 7.5 MG/0.5ML ~~LOC~~ SOAJ: 7.5000 mg | SUBCUTANEOUS | 3 refills | Status: DC

## 2023-02-13 NOTE — Telephone Encounter (Signed)
Should patient be on the 5 or 7.5 dose of Mounjaro? Looks like 5 was lase dose sent but office note says 7.5

## 2023-03-01 ENCOUNTER — Telehealth: Payer: Self-pay

## 2023-03-01 DIAGNOSIS — E1165 Type 2 diabetes mellitus with hyperglycemia: Secondary | ICD-10-CM

## 2023-03-01 MED ORDER — GLIMEPIRIDE 4 MG PO TABS: 4.0000 mg | ORAL_TABLET | Freq: Every day | ORAL | 3 refills | Status: DC

## 2023-03-01 MED ORDER — DAPAGLIFLOZIN PRO-METFORMIN ER 5-1000 MG PO TB24: 2.0000 | ORAL_TABLET | Freq: Every day | ORAL | 1 refills | Status: DC

## 2023-03-01 NOTE — Addendum Note (Signed)
Addended by: Lauralyn Primes on: 03/01/2023 10:42 AM   Modules accepted: Orders

## 2023-03-01 NOTE — Telephone Encounter (Signed)
Inbound fax requesting refill of Metformin ER 500 mg. Not on current med list. Per visit notes pt was prescribed Xigduo ER 04-999 mg also not on current med list.

## 2023-03-01 NOTE — Telephone Encounter (Signed)
Rx sent 

## 2023-03-01 NOTE — Addendum Note (Signed)
Addended by: Lauralyn Primes on: 03/01/2023 08:24 AM   Modules accepted: Orders

## 2023-03-14 ENCOUNTER — Ambulatory Visit: Payer: BC Managed Care – PPO | Admitting: Endocrinology

## 2023-03-14 ENCOUNTER — Encounter: Payer: Self-pay | Admitting: Endocrinology

## 2023-03-14 VITALS — BP 126/78 | HR 88 | Ht 65.0 in | Wt 174.4 lb

## 2023-03-14 DIAGNOSIS — E1165 Type 2 diabetes mellitus with hyperglycemia: Secondary | ICD-10-CM

## 2023-03-14 LAB — POCT GLYCOSYLATED HEMOGLOBIN (HGB A1C): Hemoglobin A1C: 5.9 % — AB (ref 4.0–5.6)

## 2023-03-14 LAB — BASIC METABOLIC PANEL
BUN: 21 mg/dL (ref 6–23)
CO2: 29 mEq/L (ref 19–32)
Calcium: 9.7 mg/dL (ref 8.4–10.5)
Chloride: 98 mEq/L (ref 96–112)
Creatinine, Ser: 0.99 mg/dL (ref 0.40–1.20)
GFR: 61.63 mL/min (ref >60.00)
Glucose, Bld: 59 mg/dL — ABNORMAL LOW (ref 70–99)
Potassium: 3.8 mEq/L (ref 3.5–5.1)
Sodium: 138 mEq/L (ref 135–145)

## 2023-03-14 NOTE — Progress Notes (Signed)
Patient ID: Cheryl Oneal, female   DOB: 10/24/1961, 62 y.o.   MRN: [id number]           Reason for Appointment: Type II Diabetes follow-up   History of Present Illness   Diagnosis date: 2007  Previous history:  Non-insulin hypoglycemic drugs previously used: Metformin, glipizide, Farxiga, Tradjenta Insulin was never started   A1c history not available from previous PCPs  Recent history:     Non-insulin hypoglycemic drugs: MOUNJARO 5 mg weekly, Xigduo 04/999, 2 tablets daily, glimepiride 4 mg daily,        Side effects from medications: Possibly constipation from metformin higher dose  Current self management, blood sugar patterns and problems identified:  A1c was 7.1 in 12/23 but now 5.9 Her blood sugars are dramatically better and she has lost a significant amount of weight This is from continuing her Darcel Bayley which was started in 7/23 Also may have lost weight from having her leg fracture last month and somewhat decreased appetite She is also taking Xigduo instead of lower doses of metformin and Farxiga separately She however did not reduce her Amaryl to 2 mg as recommended on the last visit Recently however only rarely has had a low normal reading, today was the lowest at 67 fasting She has mostly done fasting blood sugars lately No side effects from [facility] No hypoglycemic symptoms however  Diet management: Breakfast usually at 9 am at work     Hypoglycemia:  none    Glucometer: Accucheck        Blood Glucose readings from review of monitor show fasting range 66-130, evening 178 AVERAGE 95 Previously:   PRE-MEAL Fasting Lunch Dinner Bedtime Overall  Glucose range: 86-125 130   86-201  Mean/median:        POST-MEAL PC Breakfast PC Lunch PC Dinner  Glucose range:   188, 201  Mean/median:       Dietician visit: Most recent: 5/23      Weight control:  Wt Readings from Last 3 Encounters:  03/14/23 174 lb 6.4 oz (79.1 kg)  09/08/22 189 lb 9.6 oz (86  kg)  06/21/22 196 lb 12.8 oz (89.3 kg)            Diabetes labs:  Lab Results  Component Value Date   HGBA1C 5.9 (A) 03/14/2023   HGBA1C 10.10 04/26/2022   HGBA1C 10.3 (A) 03/04/2022   Lab Results  Component Value Date   LDLCALC 42 01/26/2022   CREATININE 0.9 04/26/2022     Allergies as of 03/14/2023   No Known Allergies      Medication List        Accurate as of March 14, 2023  3:37 PM. If you have any questions, ask your nurse or doctor.          amitriptyline 50 MG tablet Commonly known as: ELAVIL Take 50 mg by mouth at bedtime.   amLODipine 10 MG tablet Commonly known as: NORVASC Take 10 mg by mouth daily.   aspirin 81 MG chewable tablet Chew 81 mg by mouth daily.   Dapagliflozin Pro-metFORMIN ER 04-999 MG Tb24 Commonly known as: Xigduo XR Take 2 tablets by mouth daily.   ergocalciferol 1.25 MG (50000 UT) capsule Commonly known as: VITAMIN D2 Take 50,000 Units by mouth once a week.   glimepiride 4 MG tablet Commonly known as: AMARYL Take 1 tablet (4 mg total) by mouth daily before breakfast.   hydrochlorothiazide 12.5 MG tablet Commonly known as: HYDRODIURIL Take 25 mg by  mouth daily.   methylPREDNISolone 4 MG Tbpk tablet Commonly known as: MEDROL DOSEPAK Take as directed.   metoprolol succinate 100 MG 24 hr tablet Commonly known as: TOPROL-XL Take 100 mg by mouth daily.   pravastatin 10 MG tablet Commonly known as: PRAVACHOL Take 10 mg by mouth daily.   tirzepatide 7.5 MG/0.5ML Pen Commonly known as: MOUNJARO Inject 7.5 mg into the skin once a week.        Allergies: No Known Allergies  Past Medical History:  Diagnosis Date   Diabetes ([place])    High blood pressure    High cholesterol     Past Surgical History:  Procedure Laterality Date   CESAREAN SECTION  1989   CESAREAN SECTION  1987    Family History  Problem Relation Age of Onset   Heart failure Mother    Diabetes Father    High blood pressure Other        on  both sides of family   Diabetes Other        on both sides of family    Social History:  reports that she has been smoking cigarettes. She has been smoking an average of 1.5 packs per day. She has never used smokeless tobacco. She reports that she does not drink alcohol and does not use drugs.  Review of Systems:  Last diabetic eye exam date 4/22  Last foot exam date:12/22  Symptoms of neuropathy: None  Hypertension:   Treated by PCP with Norvasc, HCTZ and metoprolol   BP Readings from Last 3 Encounters:  03/14/23 126/78  02/01/23 124/68  09/08/22 (!) 148/82    Lipid management: Treated with 10 mg pravastatin by PCP Most recent triglycerides were 152 with LDL 70 as of 11/2022 from PCP    No results found for: "CHOL" Lab Results  Component Value Date   HDL 35 01/26/2022   Lab Results  Component Value Date   LDLCALC 42 01/26/2022   Lab Results  Component Value Date   TRIG 245 (A) 01/26/2022   No results found for: "CHOLHDL" No results found for: "LDLDIRECT"   Examination:   BP 126/78 (BP Location: Left Arm, Patient Position: Sitting, Cuff Size: Normal)   Pulse 88   Ht 5\' 5"  (1.651 m)   Wt 174 lb 6.4 oz (79.1 kg)   LMP  (LMP Unknown)   SpO2 94%   BMI 29.02 kg/m   Body mass index is 29.02 kg/m.    ASSESSMENT/ PLAN:    Diabetes type 2 non-insulin-dependent:   Current regimen: Metformin, Amaryl, Mounjaro and Farxiga  See history of present illness for detailed discussion of current diabetes management, blood sugar patterns and problems identified  A1c is now only 5.9 which is much better than usual   She is doing much better with continuing to use Mounjaro  Also has lost significant amount of weight, this may be partly because of recent pain and immobility Blood sugars appear to be lower and the fasting is only 66 at home today   Recommendations made today:  Start checking blood sugars after meals more frequently and less in the morning  No  change in [place] as yet Reduce Amaryl to 2 mg and stop if blood sugar is low More regular follow-up   Patient Instructions  Take 1/2 Glimeperide only  Check blood sugars on waking up 3 days a week  Also check blood sugars about 2 hours after meals and do this after different meals by rotation  Recommended  blood sugar levels on waking up are 90-130 and about 2 hours after meal is 130-160  Please bring your blood sugar monitor to each visit, thank you    Elayne Snare 03/14/2023, 3:37 PM

## 2023-03-14 NOTE — Patient Instructions (Addendum)
Take 1/2 Glimeperide only  Check blood sugars on waking up 3 days a week  Also check blood sugars about 2 hours after meals and do this after different meals by rotation  Recommended blood sugar levels on waking up are 90-130 and about 2 hours after meal is 130-160  Please bring your blood sugar monitor to each visit, thank you

## 2023-04-04 ENCOUNTER — Telehealth: Payer: Self-pay

## 2023-04-04 NOTE — Telephone Encounter (Signed)
Pt is out of Mounjaro and it's on backorder at pharmacy. Alternatives?

## 2023-04-05 DIAGNOSIS — I10 Essential (primary) hypertension: Secondary | ICD-10-CM | POA: Diagnosis not present

## 2023-04-05 DIAGNOSIS — F329 Major depressive disorder, single episode, unspecified: Secondary | ICD-10-CM | POA: Diagnosis not present

## 2023-04-05 DIAGNOSIS — E1169 Type 2 diabetes mellitus with other specified complication: Secondary | ICD-10-CM | POA: Diagnosis not present

## 2023-04-05 DIAGNOSIS — E559 Vitamin D deficiency, unspecified: Secondary | ICD-10-CM | POA: Diagnosis not present

## 2023-04-05 DIAGNOSIS — E785 Hyperlipidemia, unspecified: Secondary | ICD-10-CM | POA: Diagnosis not present

## 2023-04-06 ENCOUNTER — Other Ambulatory Visit: Payer: Self-pay

## 2023-04-06 MED ORDER — SEMAGLUTIDE(0.25 OR 0.5MG/DOS) 2 MG/3ML ~~LOC~~ SOPN: 0.5000 mg | PEN_INJECTOR | SUBCUTANEOUS | 2 refills | Status: DC

## 2023-04-06 NOTE — Telephone Encounter (Signed)
Medication sent and patient advised.  

## 2023-05-05 DIAGNOSIS — N760 Acute vaginitis: Secondary | ICD-10-CM | POA: Diagnosis not present

## 2023-05-27 DIAGNOSIS — N952 Postmenopausal atrophic vaginitis: Secondary | ICD-10-CM | POA: Diagnosis not present

## 2023-05-27 DIAGNOSIS — E1169 Type 2 diabetes mellitus with other specified complication: Secondary | ICD-10-CM | POA: Diagnosis not present

## 2023-05-27 DIAGNOSIS — Z6829 Body mass index (BMI) 29.0-29.9, adult: Secondary | ICD-10-CM | POA: Diagnosis not present

## 2023-06-06 ENCOUNTER — Ambulatory Visit: Payer: BC Managed Care – PPO | Admitting: "Endocrinology

## 2023-06-06 ENCOUNTER — Other Ambulatory Visit: Payer: Self-pay

## 2023-06-06 ENCOUNTER — Encounter: Payer: Self-pay | Admitting: "Endocrinology

## 2023-06-06 VITALS — BP 120/73 | HR 96 | Ht 65.0 in | Wt 182.4 lb

## 2023-06-06 DIAGNOSIS — E11649 Type 2 diabetes mellitus with hypoglycemia without coma: Secondary | ICD-10-CM | POA: Diagnosis not present

## 2023-06-06 DIAGNOSIS — E782 Mixed hyperlipidemia: Secondary | ICD-10-CM | POA: Diagnosis not present

## 2023-06-06 DIAGNOSIS — Z7984 Long term (current) use of oral hypoglycemic drugs: Secondary | ICD-10-CM | POA: Diagnosis not present

## 2023-06-06 LAB — MICROALBUMIN / CREATININE URINE RATIO
Creatinine,U: 48.9 mg/dL
Microalb Creat Ratio: 1.4 mg/g (ref 0.0–30.0)
Microalb, Ur: <0.7 mg/dL (ref 0.0–1.9)

## 2023-06-06 MED ORDER — TIRZEPATIDE 7.5 MG/0.5ML ~~LOC~~ SOAJ
7.5000 mg | SUBCUTANEOUS | 3 refills | Status: DC
  Filled 2023-06-06 (×2): qty 2, 28d supply, fill #0

## 2023-06-06 NOTE — Progress Notes (Signed)
Outpatient Endocrinology Note Cheryl Thendara, MD  06/06/23   Cheryl Oneal 1961-02-01 [id number]  Referring Provider: Marlyn Corporal, PA Primary Care Provider: Marlyn Corporal, PA Reason for consultation: Subjective   Assessment & Plan  Diagnoses and all orders for this visit:  Uncontrolled type 2 diabetes mellitus with hypoglycemia without coma (HCC) -     Microalbumin / creatinine urine ratio; Future -     Microalbumin / creatinine urine ratio  Long term (current) use of oral hypoglycemic drugs  Mixed hypercholesterolemia and hypertriglyceridemia  Other orders -     tirzepatide (MOUNJARO) 7.5 MG/0.5ML Pen; Inject 7.5 mg into the skin once a week.    Diabetes complicated by neuropathy  Hba1c goal less than 7.0, current Hba1c is 5.9. Will recommend the following: Mounjaro 7.5 mg weekly, Xigduo 04/999, 2 tablets daily, glimepiride 4 mg daily Send ozempic 1 mg if mounajro not available (pt likes mounajro)  No known contraindications to any of above medications No history of MEN syndrome/medullary thyroid cancer/pancreatitis or pancreatic cancer in self or family  Hyperlipidemia -Last LDL at goal: 11 -on pravastatin 10 mg QD -Follow low fat diet and exercise    -Blood pressure goal <140/90 - Microalbumin/creatinine  goal < 30 -not on ACE/ARB -diet changes including salt restriction -limit eating outside -counseled BP targets per standards of diabetes care -Uncontrolled blood pressure can lead to retinopathy, nephropathy and cardiovascular and atherosclerotic heart disease  Reviewed and counseled on: -A1C target -Blood sugar targets -Complications of uncontrolled diabetes  -Checking blood sugar before meals and bedtime and bring log next visit -All medications with mechanism of action and side effects -Hypoglycemia management: rule of 15's, Glucagon Emergency Kit and medical alert ID -low-carb low-fat plate-method diet -At least 20 minutes of physical  activity per day -Annual dilated retinal eye exam and foot exam -compliance and follow up needs -follow up as scheduled or earlier if problem gets worse  Call if blood sugar is less than 70 or consistently above 250    Take a 15 gm snack of carbohydrate at bedtime before you go to sleep if your blood sugar is less than 100.    If you are going to fast after midnight for a test or procedure, ask your physician for instructions on how to reduce/decrease your insulin dose.    Call if blood sugar is less than 70 or consistently above 250  -Treating a low sugar by rule of 15  (15 gms of sugar every 15 min until sugar is more than 70) If you feel your sugar is low, test your sugar to be sure If your sugar is low (less than 70), then take 15 grams of a fast acting Carbohydrate (3-4 glucose tablets or glucose gel or 4 ounces of juice or regular soda) Recheck your sugar 15 min after treating low to make sure it is more than 70 If sugar is still less than 70, treat again with 15 grams of carbohydrate          Don't drive the hour of hypoglycemia  If unconscious/unable to eat or drink by mouth, use glucagon injection or nasal spray baqsimi and call 911. Can repeat again in 15 min if still unconscious.  Return in about 4 months (around 10/06/2023) for labs now, visit.   I have reviewed current medications, nurse's notes, allergies, vital signs, past medical and surgical history, family medical history, and social history for this encounter. Counseled patient on symptoms, examination findings, lab  findings, imaging results, treatment decisions and monitoring and prognosis. The patient understood the recommendations and agrees with the treatment plan. All questions regarding treatment plan were fully answered.  Cheryl [place], MD  06/06/23    History of Present Illness Cheryl Oneal is a 62 y.o. year old female who presents for evaluation of Type 2 diabetes mellitus.  Cheryl FOCHTMAN was first  diagnosed in 2007.   Diabetes education +  Home diabetes regimen: Non-insulin hypoglycemic drugs: MOUNJARO 5 mg weekly, Xigduo 04/999, 2 tablets daily, glimepiride 4 mg daily  Previous history:  Non-insulin hypoglycemic drugs previously used: Metformin, glipizide, Farxiga, Tradjenta Insulin was never started   COMPLICATIONS -  MI/Stroke -  retinopathy +  neuropathy -  nephropathy  BLOOD SUGAR DATA 66-131 Checks BG only in morning   Physical Exam  BP 120/73 Comment (BP Location): left arm  Pulse 96   Ht 5\' 5"  (1.651 m)   Wt 182 lb 6.4 oz (82.7 kg)   LMP  (LMP Unknown)   SpO2 98%   BMI 30.35 kg/m    Constitutional: well developed, well nourished Head: normocephalic, atraumatic Eyes: sclera anicteric, no redness Neck: supple Lungs: normal respiratory effort Neurology: alert and oriented Skin: dry, no appreciable rashes Musculoskeletal: no appreciable defects Psychiatric: normal mood and affect Diabetic Foot Exam - Simple   Simple Foot Form Diabetic Foot exam was performed with the following findings: Yes 06/06/2023 10:45 AM  Visual Inspection No deformities, no ulcerations, no other skin breakdown bilaterally: Yes Sensation Testing Intact to touch and monofilament testing bilaterally: Yes Pulse Check Posterior Tibialis and Dorsalis pulse intact bilaterally: Yes Comments      Current Medications Patient's Medications  New Prescriptions   No medications on file  Previous Medications   AMITRIPTYLINE (ELAVIL) 50 MG TABLET    Take 50 mg by mouth at bedtime.   AMLODIPINE (NORVASC) 10 MG TABLET    Take 10 mg by mouth daily.   ASPIRIN 81 MG CHEWABLE TABLET    Chew 81 mg by mouth daily.   DAPAGLIFLOZIN PRO-METFORMIN ER (XIGDUO XR) 04-999 MG TB24    Take 2 tablets by mouth daily.   ERGOCALCIFEROL (VITAMIN D2) 1.25 MG (50000 UT) CAPSULE    Take 50,000 Units by mouth once a week.   GLIMEPIRIDE (AMARYL) 4 MG TABLET    Take 1 tablet (4 mg total) by mouth daily before  breakfast.   HYDROCHLOROTHIAZIDE (HYDRODIURIL) 12.5 MG TABLET    Take 25 mg by mouth daily.   METHYLPREDNISOLONE (MEDROL DOSEPAK) 4 MG TBPK TABLET    Take as directed.   METOPROLOL SUCCINATE (TOPROL-XL) 100 MG 24 HR TABLET    Take 100 mg by mouth daily.   PRAVASTATIN (PRAVACHOL) 10 MG TABLET    Take 10 mg by mouth daily.   SEMAGLUTIDE,0.25 OR 0.5MG /DOS, 2 MG/3ML SOPN    Inject 0.5 mg into the skin once a week.  Modified Medications   Modified Medication Previous Medication   TIRZEPATIDE (MOUNJARO) 7.5 MG/0.5ML PEN tirzepatide (MOUNJARO) 7.5 MG/0.5ML Pen      Inject 7.5 mg into the skin once a week.    Inject 7.5 mg into the skin once a week.  Discontinued Medications   No medications on file    Allergies No Known Allergies  Past Medical History Past Medical History:  Diagnosis Date   Diabetes (HCC)    High blood pressure    High cholesterol     Past Surgical History Past Surgical History:  Procedure Laterality Date  CESAREAN SECTION  1989   CESAREAN SECTION  1987    Family History family history includes Diabetes in her father and another family member; Heart failure in her mother; High blood pressure in an other family member.  Social History Social History   Socioeconomic History   Marital status: Single    Spouse name: Not on file   Number of children: 3   Years of education: Not on file   Highest education level: Some college, no degree  Occupational History   Not on file  Tobacco Use   Smoking status: Some Days    Packs/day: 1.5    Types: Cigarettes   Smokeless tobacco: Never  Vaping Use   Vaping Use: Never used  Substance and Sexual Activity   Alcohol use: Never   Drug use: Never   Sexual activity: Not on file  Other Topics Concern   Not on file  Social History Narrative   Lives at home with daughter & son   Right handed   Social Determinants of Health   Financial Resource Strain: Not on file  Food Insecurity: Not on file  Transportation  Needs: Not on file  Physical Activity: Not on file  Stress: Not on file  Social Connections: Not on file  Intimate Partner Violence: Not on file    Lab Results  Component Value Date   HGBA1C 5.9 (A) 03/14/2023   HGBA1C 10.10 04/26/2022   HGBA1C 10.3 (A) 03/04/2022   No results found for: "CHOL" Lab Results  Component Value Date   HDL 35 01/26/2022   Lab Results  Component Value Date   LDLCALC 42 01/26/2022   Lab Results  Component Value Date   TRIG 245 (A) 01/26/2022   No results found for: "CHOLHDL" Lab Results  Component Value Date   CREATININE 0.99 03/14/2023   Lab Results  Component Value Date   GFR 61.63 03/14/2023   No results found for: "MICROALBUR", "MALB24HUR"    Component Value Date/Time   NA 138 03/14/2023 1122   NA 141 06/27/2019 1454   K 3.8 03/14/2023 1122   CL 98 03/14/2023 1122   CO2 29 03/14/2023 1122   GLUCOSE 59 (L) 03/14/2023 1122   BUN 21 03/14/2023 1122   BUN 10 06/27/2019 1454   CREATININE 0.99 03/14/2023 1122   CALCIUM 9.7 03/14/2023 1122   PROT 7.1 06/27/2019 1454   ALBUMIN 4.1 06/27/2019 1454   AST 23 06/27/2019 1454   ALT 32 06/27/2019 1454   ALKPHOS 170 (H) 06/27/2019 1454   BILITOT 0.2 06/27/2019 1454   GFRNONAA 79 06/27/2019 1454   GFRAA 91 06/27/2019 1454      Latest Ref Rng & Units 03/14/2023   11:22 AM 04/26/2022   12:00 AM 06/27/2019    2:54 PM  BMP  Glucose 70 - 99 mg/dL 59   161   BUN 6 - 23 mg/dL 21   10   Creatinine 0.96 - 1.20 mg/dL 0.45  0.9     4.09   BUN/Creat Ratio 9 - 23   12   Sodium 135 - 145 mEq/L 138   141   Potassium 3.5 - 5.1 mEq/L 3.8   4.6   Chloride 96 - 112 mEq/L 98   98   CO2 19 - 32 mEq/L 29   26   Calcium 8.4 - 10.5 mg/dL 9.7   81.1      This result is from an external source.       Component Value Date/Time  WBC 8.7 06/27/2019 1454   RBC 4.61 06/27/2019 1454   HGB 12.8 06/27/2019 1454   HCT 39.0 06/27/2019 1454   PLT 364 06/27/2019 1454   MCV 85 06/27/2019 1454   MCH 27.8  06/27/2019 1454   MCHC 32.8 06/27/2019 1454   RDW 13.9 06/27/2019 1454   LYMPHSABS 3.1 06/27/2019 1454   EOSABS 0.2 06/27/2019 1454   BASOSABS 0.1 06/27/2019 1454     Parts of this note may have been dictated using voice recognition software. There may be variances in spelling and vocabulary which are unintentional. Not all errors are proofread. Please notify the [facility] Parkin if any discrepancies are noted or if the meaning of any statement is not clear.

## 2023-09-12 ENCOUNTER — Other Ambulatory Visit: Payer: Self-pay

## 2023-09-12 DIAGNOSIS — E1165 Type 2 diabetes mellitus with hyperglycemia: Secondary | ICD-10-CM

## 2023-09-12 MED ORDER — DAPAGLIFLOZIN PRO-METFORMIN ER 5-1000 MG PO TB24: 2.0000 | ORAL_TABLET | Freq: Every day | ORAL | 1 refills | Status: DC

## 2023-09-15 ENCOUNTER — Other Ambulatory Visit: Payer: Self-pay

## 2023-09-20 DIAGNOSIS — G5602 Carpal tunnel syndrome, left upper limb: Secondary | ICD-10-CM | POA: Diagnosis not present

## 2023-09-20 DIAGNOSIS — G5601 Carpal tunnel syndrome, right upper limb: Secondary | ICD-10-CM | POA: Diagnosis not present

## 2023-09-20 DIAGNOSIS — G5603 Carpal tunnel syndrome, bilateral upper limbs: Secondary | ICD-10-CM | POA: Diagnosis not present

## 2023-10-03 ENCOUNTER — Encounter: Payer: Self-pay | Admitting: Internal Medicine

## 2023-10-09 ENCOUNTER — Ambulatory Visit: Payer: BC Managed Care – PPO | Admitting: "Endocrinology

## 2023-10-09 ENCOUNTER — Encounter: Payer: Self-pay | Admitting: "Endocrinology

## 2023-10-09 VITALS — BP 120/60 | HR 84 | Ht 65.0 in | Wt 169.4 lb

## 2023-10-09 DIAGNOSIS — E11649 Type 2 diabetes mellitus with hypoglycemia without coma: Secondary | ICD-10-CM | POA: Diagnosis not present

## 2023-10-09 DIAGNOSIS — Z7985 Long-term (current) use of injectable non-insulin antidiabetic drugs: Secondary | ICD-10-CM

## 2023-10-09 DIAGNOSIS — E782 Mixed hyperlipidemia: Secondary | ICD-10-CM | POA: Diagnosis not present

## 2023-10-09 DIAGNOSIS — Z7984 Long term (current) use of oral hypoglycemic drugs: Secondary | ICD-10-CM

## 2023-10-09 LAB — COMPREHENSIVE METABOLIC PANEL
ALT: 15 U/L (ref 0–35)
AST: 14 U/L (ref 0–37)
Albumin: 4.2 g/dL (ref 3.5–5.2)
Alkaline Phosphatase: 127 U/L — ABNORMAL HIGH (ref 39–117)
BUN: 22 mg/dL (ref 6–23)
CO2: 28 meq/L (ref 19–32)
Calcium: 9.5 mg/dL (ref 8.4–10.5)
Chloride: 102 meq/L (ref 96–112)
Creatinine, Ser: 1.1 mg/dL (ref 0.40–1.20)
GFR: 54.09 mL/min — ABNORMAL LOW (ref >60.00)
Glucose, Bld: 63 mg/dL — ABNORMAL LOW (ref 70–99)
Potassium: 4.1 meq/L (ref 3.5–5.1)
Sodium: 139 meq/L (ref 135–145)
Total Bilirubin: 0.4 mg/dL (ref 0.2–1.2)
Total Protein: 7.2 g/dL (ref 6.0–8.3)

## 2023-10-09 LAB — POCT GLYCOSYLATED HEMOGLOBIN (HGB A1C): Hemoglobin A1C: 5.8 % — AB (ref 4.0–5.6)

## 2023-10-09 LAB — LIPID PANEL
Cholesterol: 130 mg/dL (ref 0–200)
HDL: 37.9 mg/dL — ABNORMAL LOW (ref >39.00)
LDL Cholesterol: 69 mg/dL (ref 0–99)
NonHDL: 92.55
Total CHOL/HDL Ratio: 3
Triglycerides: 119 mg/dL (ref 0.0–149.0)
VLDL: 23.8 mg/dL (ref 0.0–40.0)

## 2023-10-09 MED ORDER — TIRZEPATIDE 7.5 MG/0.5ML ~~LOC~~ SOAJ
7.5000 mg | SUBCUTANEOUS | 3 refills | Status: DC
Start: 2023-10-09 — End: 2024-01-29

## 2023-10-09 MED ORDER — PREGABALIN 75 MG PO CAPS: 75.0000 mg | ORAL_CAPSULE | Freq: Two times a day (BID) | ORAL | 2 refills | Status: DC

## 2023-10-09 NOTE — Progress Notes (Signed)
Outpatient Endocrinology Note Cheryl Lilly, MD  10/09/23   Cheryl GABRYS 1961-07-28 [id number]  Referring Provider: Marlyn Corporal, PA Primary Care Provider: Marlyn Corporal, PA Reason for consultation: Subjective   Assessment & Plan  Diagnoses and all orders for this visit:  Uncontrolled type 2 diabetes mellitus with hypoglycemia without coma (HCC) -     POCT glycosylated hemoglobin (Hb A1C) -     tirzepatide (MOUNJARO) 7.5 MG/0.5ML Pen; Inject 7.5 mg into the skin once a week. -     Lipid panel; Future -     Comprehensive metabolic panel; Future  Long term (current) use of oral hypoglycemic drugs  Long-term (current) use of injectable non-insulin antidiabetic drugs  Mixed hypercholesterolemia and hypertriglyceridemia  Other orders -     pregabalin (LYRICA) 75 MG capsule; Take 1 capsule (75 mg total) by mouth 2 (two) times daily.    Diabetes complicated by neuropathy  Hba1c goal less than 7.0, current Hba1c is 5.8%. Will recommend the following: Mounjaro 7.5 mg weekly, Xigduo 04/999, 2 tablets daily Stop glimepiride 4 mg daily (only take PRN BG >200  Start lyrica 75 mg 1-2 times a ay for neuropathy, pt already on amitriptyline 50 mg every day. Tried gapapentin in past and did not like it  No known contraindications to any of above medications No history of MEN syndrome/medullary thyroid cancer/pancreatitis or pancreatic cancer in self or family  Hyperlipidemia -Last LDL at goal: 70 -on pravastatin 10 mg QD -Follow low fat diet and exercise    -Blood pressure goal <140/90 - Microalbumin/creatinine  goal < 30 -not on ACE/ARB -diet changes including salt restriction -limit eating outside -counseled BP targets per standards of diabetes care -Uncontrolled blood pressure can lead to retinopathy, nephropathy and cardiovascular and atherosclerotic heart disease  Reviewed and counseled on: -A1C target -Blood sugar targets -Complications of uncontrolled  diabetes  -Checking blood sugar before meals and bedtime and bring log next visit -All medications with mechanism of action and side effects -Hypoglycemia management: rule of 15's, Glucagon Emergency Kit and medical alert ID -low-carb low-fat plate-method diet -At least 20 minutes of physical activity per day -Annual dilated retinal eye exam and foot exam -compliance and follow up needs -follow up as scheduled or earlier if problem gets worse  Call if blood sugar is less than 70 or consistently above 250    Take a 15 gm snack of carbohydrate at bedtime before you go to sleep if your blood sugar is less than 100.    If you are going to fast after midnight for a test or procedure, ask your physician for instructions on how to reduce/decrease your insulin dose.    Call if blood sugar is less than 70 or consistently above 250  -Treating a low sugar by rule of 15  (15 gms of sugar every 15 min until sugar is more than 70) If you feel your sugar is low, test your sugar to be sure If your sugar is low (less than 70), then take 15 grams of a fast acting Carbohydrate (3-4 glucose tablets or glucose gel or 4 ounces of juice or regular soda) Recheck your sugar 15 min after treating low to make sure it is more than 70 If sugar is still less than 70, treat again with 15 grams of carbohydrate          Don't drive the hour of hypoglycemia  If unconscious/unable to eat or drink by mouth, use glucagon injection or  nasal spray baqsimi and call 911. Can repeat again in 15 min if still unconscious.  Return for visit, labs today.   I have reviewed current medications, nurse's notes, allergies, vital signs, past medical and surgical history, family medical history, and social history for this encounter. Counseled patient on symptoms, examination findings, lab findings, imaging results, treatment decisions and monitoring and prognosis. The patient understood the recommendations and agrees with the treatment  plan. All questions regarding treatment plan were fully answered.  Cheryl [place], MD  10/09/23    History of Present Illness Cheryl Oneal is a 62 y.o. year old female who presents for evaluation of Type 2 diabetes mellitus.  Cheryl Oneal was first diagnosed in 2007.   Diabetes education +  Home diabetes regimen: Non-insulin hypoglycemic drugs: MOUNJARO 7.55 mg weekly, Xigduo 04/999, 2 tablets daily, glimepiride 4 mg daily  Previous history:  Non-insulin hypoglycemic drugs previously used: Metformin, glipizide, Farxiga, Tradjenta Insulin was never started   COMPLICATIONS -  MI/Stroke -  retinopathy +  neuropathy -  nephropathy  BLOOD SUGAR DATA 55-112 Checks BG only in morning some days   Physical Exam  BP 120/60   Pulse 84   Ht 5\' 5"  (1.651 m)   Wt 169 lb 6.4 oz (76.8 kg)   LMP  (LMP Unknown)   SpO2 97%   BMI 28.19 kg/m    Constitutional: well developed, well nourished Head: normocephalic, atraumatic Eyes: sclera anicteric, no redness Neck: supple Lungs: normal respiratory effort Neurology: alert and oriented Skin: dry, no appreciable rashes Musculoskeletal: no appreciable defects Psychiatric: normal mood and affect Diabetic Foot Exam - Simple   No data filed      Current Medications Patient's Medications  New Prescriptions   PREGABALIN (LYRICA) 75 MG CAPSULE    Take 1 capsule (75 mg total) by mouth 2 (two) times daily.  Previous Medications   AMITRIPTYLINE (ELAVIL) 50 MG TABLET    Take 50 mg by mouth at bedtime.   AMLODIPINE (NORVASC) 10 MG TABLET    Take 10 mg by mouth daily.   ASPIRIN 81 MG CHEWABLE TABLET    Chew 81 mg by mouth daily.   DAPAGLIFLOZIN PRO-METFORMIN ER (XIGDUO XR) 04-999 MG TB24    Take 2 tablets by mouth daily.   ERGOCALCIFEROL (VITAMIN D2) 1.25 MG (50000 UT) CAPSULE    Take 50,000 Units by mouth once a week.   GLUCOSAMINE 500 MG CAPS    Take by mouth.   HYDROCHLOROTHIAZIDE (HYDRODIURIL) 12.5 MG TABLET    Take 25 mg by mouth  daily.   METOPROLOL SUCCINATE (TOPROL-XL) 100 MG 24 HR TABLET    Take 100 mg by mouth daily.   OMEGA-3 FATTY ACIDS (FISH OIL) 1000 MG CAPS    Take by mouth.   PRAVASTATIN (PRAVACHOL) 10 MG TABLET    Take 10 mg by mouth daily.   VITAMIN E 180 MG (400 UNITS) CAPSULE    Take 400 Units by mouth daily.  Modified Medications   Modified Medication Previous Medication   TIRZEPATIDE (MOUNJARO) 7.5 MG/0.5ML PEN tirzepatide (MOUNJARO) 7.5 MG/0.5ML Pen      Inject 7.5 mg into the skin once a week.    Inject 7.5 mg into the skin once a week.  Discontinued Medications   GLIMEPIRIDE (AMARYL) 4 MG TABLET    Take 1 tablet (4 mg total) by mouth daily before breakfast.   METHYLPREDNISOLONE (MEDROL DOSEPAK) 4 MG TBPK TABLET    Take as directed.   SEMAGLUTIDE,0.25 OR 0.5MG /DOS,  2 MG/3ML SOPN    Inject 0.5 mg into the skin once a week.    Allergies No Known Allergies  Past Medical History Past Medical History:  Diagnosis Date   Diabetes (HCC)    High blood pressure    High cholesterol     Past Surgical History Past Surgical History:  Procedure Laterality Date   CESAREAN SECTION  1989   CESAREAN SECTION  1987    Family History family history includes Diabetes in her father and another family member; Heart failure in her mother; High blood pressure in an other family member.  Social History Social History   Socioeconomic History   Marital status: Single    Spouse name: Not on file   Number of children: 3   Years of education: Not on file   Highest education level: Some college, no degree  Occupational History   Not on file  Tobacco Use   Smoking status: Some Days    Current packs/day: 1.50    Types: Cigarettes   Smokeless tobacco: Never  Vaping Use   Vaping status: Never Used  Substance and Sexual Activity   Alcohol use: Never   Drug use: Never   Sexual activity: Not on file  Other Topics Concern   Not on file  Social History Narrative   Lives at home with daughter & son   Right  handed   Social Determinants of Health   Financial Resource Strain: Not on file  Food Insecurity: Not on file  Transportation Needs: Not on file  Physical Activity: Not on file  Stress: Not on file  Social Connections: Not on file  Intimate Partner Violence: Not on file    Lab Results  Component Value Date   HGBA1C 5.8 (A) 10/09/2023   HGBA1C 5.9 (A) 03/14/2023   HGBA1C 10.10 04/26/2022   No results found for: "CHOL" Lab Results  Component Value Date   HDL 35 01/26/2022   Lab Results  Component Value Date   LDLCALC 42 01/26/2022   Lab Results  Component Value Date   TRIG 245 (A) 01/26/2022   No results found for: "CHOLHDL" Lab Results  Component Value Date   CREATININE 0.99 03/14/2023   Lab Results  Component Value Date   GFR 61.63 03/14/2023   Lab Results  Component Value Date   MICROALBUR <0.7 06/06/2023      Component Value Date/Time   NA 138 03/14/2023 1122   NA 141 06/27/2019 1454   K 3.8 03/14/2023 1122   CL 98 03/14/2023 1122   CO2 29 03/14/2023 1122   GLUCOSE 59 (L) 03/14/2023 1122   BUN 21 03/14/2023 1122   BUN 10 06/27/2019 1454   CREATININE 0.99 03/14/2023 1122   CALCIUM 9.7 03/14/2023 1122   PROT 7.1 06/27/2019 1454   ALBUMIN 4.1 06/27/2019 1454   AST 23 06/27/2019 1454   ALT 32 06/27/2019 1454   ALKPHOS 170 (H) 06/27/2019 1454   BILITOT 0.2 06/27/2019 1454   GFRNONAA 79 06/27/2019 1454   GFRAA 91 06/27/2019 1454      Latest Ref Rng & Units 03/14/2023   11:22 AM 04/26/2022   12:00 AM 06/27/2019    2:54 PM  BMP  Glucose 70 - 99 mg/dL 59   086   BUN 6 - 23 mg/dL 21   10   Creatinine 5.78 - 1.20 mg/dL 4.69  0.9     6.29   BUN/Creat Ratio 9 - 23   12   Sodium 135 -  145 mEq/L 138   141   Potassium 3.5 - 5.1 mEq/L 3.8   4.6   Chloride 96 - 112 mEq/L 98   98   CO2 19 - 32 mEq/L 29   26   Calcium 8.4 - 10.5 mg/dL 9.7   16.1      This result is from an external source.       Component Value Date/Time   WBC 8.7 06/27/2019 1454   RBC  4.61 06/27/2019 1454   HGB 12.8 06/27/2019 1454   HCT 39.0 06/27/2019 1454   PLT 364 06/27/2019 1454   MCV 85 06/27/2019 1454   MCH 27.8 06/27/2019 1454   MCHC 32.8 06/27/2019 1454   RDW 13.9 06/27/2019 1454   LYMPHSABS 3.1 06/27/2019 1454   EOSABS 0.2 06/27/2019 1454   BASOSABS 0.1 06/27/2019 1454     Parts of this note may have been dictated using voice recognition software. There may be variances in spelling and vocabulary which are unintentional. Not all errors are proofread. Please notify the [facility] Parkin if any discrepancies are noted or if the meaning of any statement is not clear.

## 2023-10-09 NOTE — Patient Instructions (Signed)

## 2023-12-08 DIAGNOSIS — Z87891 Personal history of nicotine dependence: Secondary | ICD-10-CM | POA: Diagnosis not present

## 2023-12-08 DIAGNOSIS — E1169 Type 2 diabetes mellitus with other specified complication: Secondary | ICD-10-CM | POA: Diagnosis not present

## 2023-12-08 DIAGNOSIS — Z6827 Body mass index (BMI) 27.0-27.9, adult: Secondary | ICD-10-CM | POA: Diagnosis not present

## 2023-12-08 DIAGNOSIS — E119 Type 2 diabetes mellitus without complications: Secondary | ICD-10-CM | POA: Diagnosis not present

## 2023-12-08 DIAGNOSIS — E1159 Type 2 diabetes mellitus with other circulatory complications: Secondary | ICD-10-CM | POA: Diagnosis not present

## 2023-12-08 DIAGNOSIS — Z0000 Encounter for general adult medical examination without abnormal findings: Secondary | ICD-10-CM | POA: Diagnosis not present

## 2023-12-08 DIAGNOSIS — E559 Vitamin D deficiency, unspecified: Secondary | ICD-10-CM | POA: Diagnosis not present

## 2023-12-08 DIAGNOSIS — I10 Essential (primary) hypertension: Secondary | ICD-10-CM | POA: Diagnosis not present

## 2023-12-14 DIAGNOSIS — G5603 Carpal tunnel syndrome, bilateral upper limbs: Secondary | ICD-10-CM | POA: Insufficient documentation

## 2023-12-14 DIAGNOSIS — G5622 Lesion of ulnar nerve, left upper limb: Secondary | ICD-10-CM | POA: Diagnosis not present

## 2023-12-14 DIAGNOSIS — E119 Type 2 diabetes mellitus without complications: Secondary | ICD-10-CM | POA: Insufficient documentation

## 2023-12-22 DIAGNOSIS — Z1231 Encounter for screening mammogram for malignant neoplasm of breast: Secondary | ICD-10-CM | POA: Diagnosis not present

## 2024-01-09 ENCOUNTER — Ambulatory Visit: Payer: BC Managed Care – PPO | Admitting: "Endocrinology

## 2024-01-10 ENCOUNTER — Encounter: Payer: Self-pay | Admitting: "Endocrinology

## 2024-01-10 ENCOUNTER — Ambulatory Visit: Payer: BC Managed Care – PPO | Admitting: "Endocrinology

## 2024-01-10 VITALS — BP 130/70 | HR 73 | Ht 65.0 in | Wt 168.4 lb

## 2024-01-10 DIAGNOSIS — E7800 Pure hypercholesterolemia, unspecified: Secondary | ICD-10-CM | POA: Diagnosis not present

## 2024-01-10 DIAGNOSIS — E11649 Type 2 diabetes mellitus with hypoglycemia without coma: Secondary | ICD-10-CM

## 2024-01-10 DIAGNOSIS — Z7984 Long term (current) use of oral hypoglycemic drugs: Secondary | ICD-10-CM | POA: Diagnosis not present

## 2024-01-10 DIAGNOSIS — Z7985 Long-term (current) use of injectable non-insulin antidiabetic drugs: Secondary | ICD-10-CM

## 2024-01-10 LAB — POCT GLYCOSYLATED HEMOGLOBIN (HGB A1C): Hemoglobin A1C: 6.1 % — AB (ref 4.0–5.6)

## 2024-01-10 NOTE — Progress Notes (Signed)
 Outpatient Endocrinology Note Cheryl Newcomer, MD  01/10/24   Cheryl Oneal 1961-01-13 [id number]  Referring Provider: Antionette Kirks, PA Primary Care Provider: Antionette Kirks, PA Reason for consultation: Subjective   Assessment & Plan  Diagnoses and all orders for this visit:  Uncontrolled type 2 diabetes mellitus with hypoglycemia without coma (HCC) -     POCT glycosylated hemoglobin (Hb A1C) -     Basic metabolic panel; Future  Long term (current) use of oral hypoglycemic drugs  Long-term (current) use of injectable non-insulin antidiabetic drugs  Pure hypercholesterolemia    Diabetes complicated by neuropathy  Hba1c goal less than 7.0, current Hba1c is 6.1%. Will recommend the following: Mounjaro  7.5 mg weekly, Xigduo  04/999, 2 tablets daily Stop glimepiride  4 mg daily (only take PRN BG >200  On lyrica  75 mg twice a day for neuropathy, pt already on amitriptyline 50 mg every day. Tried gapapentin in past and did not like it  No known contraindications to any of above medications No history of MEN syndrome/medullary thyroid  cancer/pancreatitis or pancreatic cancer in self or family  Hyperlipidemia -Last LDL at goal: 35 -on pravastatin 10 mg QD -Follow low fat diet and exercise   -Blood pressure goal <140/90 - Microalbumin/creatinine  goal < 30 -not on ACE/ARB -diet changes including salt restriction -limit eating outside -counseled BP targets per standards of diabetes care -Uncontrolled blood pressure can lead to retinopathy, nephropathy and cardiovascular and atherosclerotic heart disease  Reviewed and counseled on: -A1C target -Blood sugar targets -Complications of uncontrolled diabetes  -Checking blood sugar before meals and bedtime and bring log next visit -All medications with mechanism of action and side effects -Hypoglycemia management: rule of 15's, Glucagon Emergency Kit and medical alert ID -low-carb low-fat plate-method diet -At least  20 minutes of physical activity per day -Annual dilated retinal eye exam and foot exam -compliance and follow up needs -follow up as scheduled or earlier if problem gets worse  Call if blood sugar is less than 70 or consistently above 250    Take a 15 gm snack of carbohydrate at bedtime before you go to sleep if your blood sugar is less than 100.    If you are going to fast after midnight for a test or procedure, ask your physician for instructions on how to reduce/decrease your insulin dose.    Call if blood sugar is less than 70 or consistently above 250  -Treating a low sugar by rule of 15  (15 gms of sugar every 15 min until sugar is more than 70) If you feel your sugar is low, test your sugar to be sure If your sugar is low (less than 70), then take 15 grams of a fast acting Carbohydrate (3-4 glucose tablets or glucose gel or 4 ounces of juice or regular soda) Recheck your sugar 15 min after treating low to make sure it is more than 70 If sugar is still less than 70, treat again with 15 grams of carbohydrate          Don't drive the hour of hypoglycemia  If unconscious/unable to eat or drink by mouth, use glucagon injection or nasal spray baqsimi and call 911. Can repeat again in 15 min if still unconscious.  Return in about 3 months (around 04/09/2024).   I have reviewed current medications, nurse's notes, allergies, vital signs, past medical and surgical history, family medical history, and social history for this encounter. Counseled patient on symptoms, examination findings, lab  findings, imaging results, treatment decisions and monitoring and prognosis. The patient understood the recommendations and agrees with the treatment plan. All questions regarding treatment plan were fully answered.  Cheryl Newcomer, MD  01/10/24    History of Present Illness Cheryl Oneal is a 63 y.o. year old female who presents for evaluation of Type 2 diabetes mellitus.  Cheryl Oneal was first  diagnosed in 2007.   Diabetes education +  Home diabetes regimen: mounjaro  7.5 mg weekly, Xigduo  04/999 twice tablets daily  Stopped glimepiride  4 mg daily  Previous history:  Non-insulin hypoglycemic drugs previously used: Metformin , glipizide, Farxiga, Tradjenta  Insulin was never started   COMPLICATIONS -  MI/Stroke -  retinopathy +  neuropathy -  nephropathy  BLOOD SUGAR DATA 90-191 Checks BG only in morning some days   Physical Exam  BP 130/70   Pulse 73   Ht 5\' 5"  (1.651 m)   Wt 168 lb 6.4 oz (76.4 kg)   LMP  (LMP Unknown)   SpO2 98%   BMI 28.02 kg/m    Constitutional: well developed, well nourished Head: normocephalic, atraumatic Eyes: sclera anicteric, no redness Neck: supple Lungs: normal respiratory effort Neurology: alert and oriented Skin: dry, no appreciable rashes Musculoskeletal: no appreciable defects Psychiatric: normal mood and affect Diabetic Foot Exam - Simple   No data filed      Current Medications Patient's Medications  New Prescriptions   No medications on file  Previous Medications   AMITRIPTYLINE (ELAVIL) 50 MG TABLET    Take 50 mg by mouth at bedtime.   AMLODIPINE (NORVASC) 10 MG TABLET    Take 10 mg by mouth daily.   ASPIRIN 81 MG CHEWABLE TABLET    Chew 81 mg by mouth daily.   DAPAGLIFLOZIN  PRO-METFORMIN  ER (XIGDUO  XR) 04-999 MG TB24    Take 2 tablets by mouth daily.   ERGOCALCIFEROL (VITAMIN D2) 1.25 MG (50000 UT) CAPSULE    Take 50,000 Units by mouth once a week.   GLUCOSAMINE 500 MG CAPS    Take by mouth.   HYDROCHLOROTHIAZIDE (HYDRODIURIL) 12.5 MG TABLET    Take 25 mg by mouth daily.   METOPROLOL SUCCINATE (TOPROL-XL) 100 MG 24 HR TABLET    Take 100 mg by mouth daily.   OMEGA-3 FATTY ACIDS (FISH OIL) 1000 MG CAPS    Take by mouth.   PRAVASTATIN (PRAVACHOL) 10 MG TABLET    Take 10 mg by mouth daily.   PREGABALIN  (LYRICA ) 75 MG CAPSULE    Take 1 capsule (75 mg total) by mouth 2 (two) times daily.   TIRZEPATIDE  (MOUNJARO ) 7.5  MG/0.5ML PEN    Inject 7.5 mg into the skin once a week.   VITAMIN E 180 MG (400 UNITS) CAPSULE    Take 400 Units by mouth daily.  Modified Medications   No medications on file  Discontinued Medications   No medications on file    Allergies No Known Allergies  Past Medical History Past Medical History:  Diagnosis Date   Diabetes (HCC)    High blood pressure    High cholesterol     Past Surgical History Past Surgical History:  Procedure Laterality Date   CESAREAN SECTION  1989   CESAREAN SECTION  1987    Family History family history includes Diabetes in her father and another family member; Heart failure in her mother; High blood pressure in an other family member.  Social History Social History   Socioeconomic History   Marital status: Single  Spouse name: Not on file   Number of children: 3   Years of education: Not on file   Highest education level: Some college, no degree  Occupational History   Not on file  Tobacco Use   Smoking status: Some Days    Current packs/day: 1.50    Types: Cigarettes   Smokeless tobacco: Never  Vaping Use   Vaping status: Never Used  Substance and Sexual Activity   Alcohol use: Never   Drug use: Never   Sexual activity: Not on file  Other Topics Concern   Not on file  Social History Narrative   Lives at home with daughter & son   Right handed   Social Drivers of Health   Financial Resource Strain: Not on file  Food Insecurity: Not on file  Transportation Needs: Not on file  Physical Activity: Not on file  Stress: Not on file  Social Connections: Not on file  Intimate Partner Violence: Not on file    Lab Results  Component Value Date   HGBA1C 6.1 (A) 01/10/2024   HGBA1C 5.8 (A) 10/09/2023   HGBA1C 5.9 (A) 03/14/2023   Lab Results  Component Value Date   CHOL 130 10/09/2023   Lab Results  Component Value Date   HDL 37.90 (L) 10/09/2023   Lab Results  Component Value Date   LDLCALC 69 10/09/2023   Lab  Results  Component Value Date   TRIG 119.0 10/09/2023   Lab Results  Component Value Date   CHOLHDL 3 10/09/2023   Lab Results  Component Value Date   CREATININE 1.10 10/09/2023   Lab Results  Component Value Date   GFR 54.09 (L) 10/09/2023   Lab Results  Component Value Date   MICROALBUR <0.7 06/06/2023      Component Value Date/Time   NA 139 10/09/2023 1030   NA 141 06/27/2019 1454   K 4.1 10/09/2023 1030   CL 102 10/09/2023 1030   CO2 28 10/09/2023 1030   GLUCOSE 63 (L) 10/09/2023 1030   BUN 22 10/09/2023 1030   BUN 10 06/27/2019 1454   CREATININE 1.10 10/09/2023 1030   CALCIUM 9.5 10/09/2023 1030   PROT 7.2 10/09/2023 1030   PROT 7.1 06/27/2019 1454   ALBUMIN 4.2 10/09/2023 1030   ALBUMIN 4.1 06/27/2019 1454   AST 14 10/09/2023 1030   ALT 15 10/09/2023 1030   ALKPHOS 127 (H) 10/09/2023 1030   BILITOT 0.4 10/09/2023 1030   BILITOT 0.2 06/27/2019 1454   GFRNONAA 79 06/27/2019 1454   GFRAA 91 06/27/2019 1454      Latest Ref Rng & Units 10/09/2023   10:30 AM 03/14/2023   11:22 AM 04/26/2022   12:00 AM  BMP  Glucose 70 - 99 mg/dL 63  59    BUN 6 - 23 mg/dL 22  21    Creatinine 1.61 - 1.20 mg/dL 0.96  0.45  0.9      Sodium 135 - 145 mEq/L 139  138    Potassium 3.5 - 5.1 mEq/L 4.1  3.8    Chloride 96 - 112 mEq/L 102  98    CO2 19 - 32 mEq/L 28  29    Calcium 8.4 - 10.5 mg/dL 9.5  9.7       This result is from an external source.       Component Value Date/Time   WBC 8.7 06/27/2019 1454   RBC 4.61 06/27/2019 1454   HGB 12.8 06/27/2019 1454   HCT 39.0 06/27/2019  1454   PLT 364 06/27/2019 1454   MCV 85 06/27/2019 1454   MCH 27.8 06/27/2019 1454   MCHC 32.8 06/27/2019 1454   RDW 13.9 06/27/2019 1454   LYMPHSABS 3.1 06/27/2019 1454   EOSABS 0.2 06/27/2019 1454   BASOSABS 0.1 06/27/2019 1454     Parts of this note may have been dictated using voice recognition software. There may be variances in spelling and vocabulary which are unintentional. Not all  errors are proofread. Please notify the [facility] if any discrepancies are noted or if the meaning of any statement is not clear.

## 2024-01-10 NOTE — Addendum Note (Signed)
 Addended by: Vernon Goodpasture on: 01/10/2024 01:55 PM   Modules accepted: Orders

## 2024-01-10 NOTE — Patient Instructions (Signed)

## 2024-01-11 LAB — BASIC METABOLIC PANEL
BUN: 20 mg/dL (ref 7–25)
CO2: 26 mmol/L (ref 20–32)
Calcium: 9.2 mg/dL (ref 8.6–10.4)
Chloride: 102 mmol/L (ref 98–110)
Creat: 0.95 mg/dL (ref 0.50–1.05)
Glucose, Bld: 81 mg/dL (ref 65–99)
Potassium: 4 mmol/L (ref 3.5–5.3)
Sodium: 138 mmol/L (ref 135–146)

## 2024-01-16 DIAGNOSIS — Z1231 Encounter for screening mammogram for malignant neoplasm of breast: Secondary | ICD-10-CM | POA: Diagnosis not present

## 2024-01-19 ENCOUNTER — Other Ambulatory Visit: Payer: Self-pay | Admitting: "Endocrinology

## 2024-01-28 ENCOUNTER — Other Ambulatory Visit: Payer: Self-pay | Admitting: "Endocrinology

## 2024-01-28 DIAGNOSIS — E11649 Type 2 diabetes mellitus with hypoglycemia without coma: Secondary | ICD-10-CM

## 2024-02-02 DIAGNOSIS — G5622 Lesion of ulnar nerve, left upper limb: Secondary | ICD-10-CM | POA: Diagnosis not present

## 2024-02-02 DIAGNOSIS — G5603 Carpal tunnel syndrome, bilateral upper limbs: Secondary | ICD-10-CM | POA: Diagnosis not present

## 2024-02-21 LAB — HM DIABETES EYE EXAM

## 2024-02-22 ENCOUNTER — Other Ambulatory Visit: Payer: Self-pay | Admitting: "Endocrinology

## 2024-02-22 DIAGNOSIS — E11649 Type 2 diabetes mellitus with hypoglycemia without coma: Secondary | ICD-10-CM

## 2024-02-23 ENCOUNTER — Other Ambulatory Visit: Payer: Self-pay

## 2024-02-23 DIAGNOSIS — E11649 Type 2 diabetes mellitus with hypoglycemia without coma: Secondary | ICD-10-CM

## 2024-02-23 MED ORDER — PREGABALIN 75 MG PO CAPS: 75.0000 mg | ORAL_CAPSULE | Freq: Two times a day (BID) | ORAL | 0 refills | Status: DC

## 2024-02-25 ENCOUNTER — Other Ambulatory Visit: Payer: Self-pay | Admitting: "Endocrinology

## 2024-02-25 DIAGNOSIS — E11649 Type 2 diabetes mellitus with hypoglycemia without coma: Secondary | ICD-10-CM

## 2024-02-27 NOTE — Telephone Encounter (Signed)
 Medication refill request complete

## 2024-03-01 ENCOUNTER — Other Ambulatory Visit: Payer: Self-pay | Admitting: "Endocrinology

## 2024-03-01 DIAGNOSIS — E11649 Type 2 diabetes mellitus with hypoglycemia without coma: Secondary | ICD-10-CM

## 2024-03-01 DIAGNOSIS — I10 Essential (primary) hypertension: Secondary | ICD-10-CM | POA: Diagnosis not present

## 2024-03-01 DIAGNOSIS — E119 Type 2 diabetes mellitus without complications: Secondary | ICD-10-CM | POA: Diagnosis not present

## 2024-03-01 DIAGNOSIS — E1159 Type 2 diabetes mellitus with other circulatory complications: Secondary | ICD-10-CM | POA: Diagnosis not present

## 2024-03-01 DIAGNOSIS — E1169 Type 2 diabetes mellitus with other specified complication: Secondary | ICD-10-CM | POA: Diagnosis not present

## 2024-03-06 ENCOUNTER — Other Ambulatory Visit: Payer: Self-pay | Admitting: "Endocrinology

## 2024-03-06 DIAGNOSIS — E11649 Type 2 diabetes mellitus with hypoglycemia without coma: Secondary | ICD-10-CM

## 2024-03-07 MED ORDER — PREGABALIN 75 MG PO CAPS: 75.0000 mg | ORAL_CAPSULE | Freq: Two times a day (BID) | ORAL | 0 refills | Status: DC

## 2024-03-09 DIAGNOSIS — M549 Dorsalgia, unspecified: Secondary | ICD-10-CM | POA: Diagnosis not present

## 2024-03-09 DIAGNOSIS — S39012A Strain of muscle, fascia and tendon of lower back, initial encounter: Secondary | ICD-10-CM | POA: Diagnosis not present

## 2024-03-15 ENCOUNTER — Telehealth: Payer: Self-pay | Admitting: "Endocrinology

## 2024-03-15 DIAGNOSIS — G5622 Lesion of ulnar nerve, left upper limb: Secondary | ICD-10-CM | POA: Diagnosis not present

## 2024-03-15 DIAGNOSIS — G5603 Carpal tunnel syndrome, bilateral upper limbs: Secondary | ICD-10-CM | POA: Diagnosis not present

## 2024-03-15 DIAGNOSIS — E11649 Type 2 diabetes mellitus with hypoglycemia without coma: Secondary | ICD-10-CM

## 2024-03-15 MED ORDER — PREGABALIN 75 MG PO CAPS: 75.0000 mg | ORAL_CAPSULE | Freq: Two times a day (BID) | ORAL | 0 refills | Status: DC

## 2024-03-15 NOTE — Telephone Encounter (Signed)
 Signed by Dr. Elvera Lennox

## 2024-03-15 NOTE — Telephone Encounter (Signed)
 MEDICATION:  pregabalin pregabalin (LYRICA) 75 MG capsule  PHARMACY:  [facility] [address], [place] - [address] (Ph: [phone])   HAS THE PATIENT CONTACTED THEIR PHARMACY?  Yes  IS THIS A 90 DAY SUPPLY : Yes  IS PATIENT OUT OF MEDICATION: Yes  IF NOT; HOW MUCH IS LEFT:   LAST APPOINTMENT DATE: @1 /15/2025  NEXT APPOINTMENT DATE:@4 /14/2025  DO WE HAVE YOUR PERMISSION TO LEAVE A DETAILED MESSAGE?:Yes  OTHER COMMENTS: Patient states that the pharmacy has told her that they do not have a prescription a few times.  Patient wants to know if there is any way to re-send the prescription.   **Let patient know to contact pharmacy at the end of the day to make sure medication is ready. **  ** Please notify patient to allow 48-72 hours to process**  **Encourage patient to contact the pharmacy for refills or they can request refills through [facility]**

## 2024-03-20 DIAGNOSIS — L650 Telogen effluvium: Secondary | ICD-10-CM | POA: Diagnosis not present

## 2024-03-23 ENCOUNTER — Other Ambulatory Visit: Payer: Self-pay | Admitting: "Endocrinology

## 2024-03-23 DIAGNOSIS — E11649 Type 2 diabetes mellitus with hypoglycemia without coma: Secondary | ICD-10-CM

## 2024-03-25 NOTE — Telephone Encounter (Signed)
 Requested Prescriptions   Pending Prescriptions Disp Refills   MOUNJARO 7.5 MG/0.5ML Pen [Pharmacy Med Name: Mounjaro 7.5 MG/0.5ML Subcutaneous Solution Pen-injector] 4 mL 0    Sig: INJECT 1 SYRINGE SUBCUTANEOUSLY ONCE A WEEK

## 2024-03-29 ENCOUNTER — Other Ambulatory Visit: Payer: Self-pay | Admitting: "Endocrinology

## 2024-03-29 DIAGNOSIS — E1165 Type 2 diabetes mellitus with hyperglycemia: Secondary | ICD-10-CM

## 2024-03-29 NOTE — Telephone Encounter (Signed)
 Requested Prescriptions   Pending Prescriptions Disp Refills   XIGDUO XR 04-999 MG TB24 [Pharmacy Med Name: Xigduo XR 04-999 MG Oral Tablet Extended Release 24 Hour] 180 tablet 0    Sig: Take 2 tablets by mouth once daily

## 2024-04-04 ENCOUNTER — Other Ambulatory Visit: Payer: Self-pay | Admitting: "Endocrinology

## 2024-04-04 DIAGNOSIS — E1165 Type 2 diabetes mellitus with hyperglycemia: Secondary | ICD-10-CM

## 2024-04-04 DIAGNOSIS — M1712 Unilateral primary osteoarthritis, left knee: Secondary | ICD-10-CM | POA: Diagnosis not present

## 2024-04-08 ENCOUNTER — Ambulatory Visit: Payer: BC Managed Care – PPO | Admitting: "Endocrinology

## 2024-04-18 MED ORDER — DAPAGLIFLOZIN PRO-METFORMIN ER 5-1000 MG PO TB24: 2.0000 | ORAL_TABLET | Freq: Every day | ORAL | 0 refills | Status: DC

## 2024-04-18 NOTE — Telephone Encounter (Signed)
 Requested Prescriptions   Pending Prescriptions Disp Refills   Dapagliflozin  Pro-metFORMIN  ER (XIGDUO  XR) 04-999 MG TB24 180 tablet 0    Sig: Take 2 tablets by mouth daily.

## 2024-04-21 ENCOUNTER — Other Ambulatory Visit: Payer: Self-pay | Admitting: "Endocrinology

## 2024-04-21 DIAGNOSIS — E11649 Type 2 diabetes mellitus with hypoglycemia without coma: Secondary | ICD-10-CM

## 2024-04-22 NOTE — Telephone Encounter (Signed)
 Requested Prescriptions   Pending Prescriptions Disp Refills   MOUNJARO  7.5 MG/0.5ML Pen [Pharmacy Med Name: Mounjaro  7.5 MG/0.5ML Subcutaneous Solution Pen-injector] 4 mL 0    Sig: INJECT 1 PEN SUBCUTANEOUSLY ONCE A WEEK

## 2024-05-01 ENCOUNTER — Ambulatory Visit: Admitting: "Endocrinology

## 2024-05-01 ENCOUNTER — Encounter: Payer: Self-pay | Admitting: "Endocrinology

## 2024-05-01 VITALS — BP 110/80 | HR 96 | Ht 65.0 in | Wt 167.0 lb

## 2024-05-01 DIAGNOSIS — Z7985 Long-term (current) use of injectable non-insulin antidiabetic drugs: Secondary | ICD-10-CM

## 2024-05-01 DIAGNOSIS — Z7984 Long term (current) use of oral hypoglycemic drugs: Secondary | ICD-10-CM | POA: Diagnosis not present

## 2024-05-01 DIAGNOSIS — E7800 Pure hypercholesterolemia, unspecified: Secondary | ICD-10-CM

## 2024-05-01 DIAGNOSIS — E1140 Type 2 diabetes mellitus with diabetic neuropathy, unspecified: Secondary | ICD-10-CM

## 2024-05-01 LAB — POCT GLYCOSYLATED HEMOGLOBIN (HGB A1C): Hemoglobin A1C: 6.2 % — AB (ref 4.0–5.6)

## 2024-05-01 NOTE — Progress Notes (Signed)
 Outpatient Endocrinology Note [name], MD  05/01/24   Cheryl Oneal 19-Dec-1961 [id number]  Referring Provider: Antionette Kirks, PA Primary Care Provider: Antionette Kirks, PA Reason for consultation: Subjective   Assessment & Plan  Diagnoses and all orders for this visit:  Type 2 diabetes mellitus with diabetic neuropathy, without long-term current use of insulin (HCC) -     POCT glycosylated hemoglobin (Hb A1C)  Long term (current) use of oral hypoglycemic drugs  Long-term (current) use of injectable non-insulin antidiabetic drugs  Pure hypercholesterolemia    Diabetes complicated by neuropathy  Hba1c goal less than 7.0, current Hba1c is 6.2%. Will recommend the following: Mounjaro  7.5 mg weekly, Xigduo  04/999 mg bid On lyrica  75 mg bid for neuropathy Stop glimepiride  4 mg daily (only take PRN BG >200  On lyrica  75 mg twice a day for neuropathy, pt already on amitriptyline 50 mg every day. Tried gapapentin in past and did not like it  No known contraindications to any of above medications No history of MEN syndrome/medullary thyroid  cancer/pancreatitis or pancreatic cancer in self or family  Hyperlipidemia -Last LDL at goal: 105 -on pravastatin 10 mg QD -Follow low fat diet and exercise   -Blood pressure goal <140/90 - Microalbumin/creatinine  goal < 30 -not on ACE/ARB -diet changes including salt restriction -limit eating outside -counseled BP targets per standards of diabetes care -Uncontrolled blood pressure can lead to retinopathy, nephropathy and cardiovascular and atherosclerotic heart disease  Reviewed and counseled on: -A1C target -Blood sugar targets -Complications of uncontrolled diabetes  -Checking blood sugar before meals and bedtime and bring log next visit -All medications with mechanism of action and side effects -Hypoglycemia management: rule of 15's, Glucagon Emergency Kit and medical alert ID -low-carb low-fat plate-method  diet -At least 20 minutes of physical activity per day -Annual dilated retinal eye exam and foot exam -compliance and follow up needs -follow up as scheduled or earlier if problem gets worse  Call if blood sugar is less than 70 or consistently above 250    Take a 15 gm snack of carbohydrate at bedtime before you go to sleep if your blood sugar is less than 100.    If you are going to fast after midnight for a test or procedure, ask your physician for instructions on how to reduce/decrease your insulin dose.    Call if blood sugar is less than 70 or consistently above 250  -Treating a low sugar by rule of 15  (15 gms of sugar every 15 min until sugar is more than 70) If you feel your sugar is low, test your sugar to be sure If your sugar is low (less than 70), then take 15 grams of a fast acting Carbohydrate (3-4 glucose tablets or glucose gel or 4 ounces of juice or regular soda) Recheck your sugar 15 min after treating low to make sure it is more than 70 If sugar is still less than 70, treat again with 15 grams of carbohydrate          Don't drive the hour of hypoglycemia  If unconscious/unable to eat or drink by mouth, use glucagon injection or nasal spray baqsimi and call 911. Can repeat again in 15 min if still unconscious.  No follow-ups on file. Recommend follow-up with PCP given well-controlled diabetes  I have reviewed current medications, nurse's notes, allergies, vital signs, past medical and surgical history, family medical history, and social history for this encounter. Counseled patient on  symptoms, examination findings, lab findings, imaging results, treatment decisions and monitoring and prognosis. The patient understood the recommendations and agrees with the treatment plan. All questions regarding treatment plan were fully answered.  [name], MD  05/01/24    History of Present Illness Cheryl Oneal is a 63 y.o. year old female who presents for follow-up on  Type 2 diabetes mellitus.  Cheryl Oneal was first diagnosed in 2007.   Diabetes education +  Home diabetes regimen: Mounjaro  7.5 mg weekly, Xigduo  04/999 twice tablets daily  Stopped glimepiride  4 mg daily  Previous history:  Non-insulin hypoglycemic drugs previously used: Metformin , glipizide, Farxiga, Tradjenta  Insulin was never started   COMPLICATIONS -  MI/Stroke -  retinopathy +  neuropathy -  nephropathy  BLOOD SUGAR DATA 96-126 Checks BG different times some days   Physical Exam  BP 110/80   Pulse 96   Ht 5\' 5"  (1.651 m)   Wt 167 lb (75.8 kg)   LMP  (LMP Unknown)   SpO2 98%   BMI 27.79 kg/m    Constitutional: well developed, well nourished Head: normocephalic, atraumatic Eyes: sclera anicteric, no redness Neck: supple Lungs: normal respiratory effort Neurology: alert and oriented Skin: dry, no appreciable rashes Musculoskeletal: no appreciable defects Psychiatric: normal mood and affect Diabetic Foot Exam - Simple   No data filed      Current Medications Patient's Medications  New Prescriptions   No medications on file  Previous Medications   AMITRIPTYLINE (ELAVIL) 50 MG TABLET    Take 50 mg by mouth at bedtime.   AMLODIPINE (NORVASC) 10 MG TABLET    Take 10 mg by mouth daily.   ASPIRIN 81 MG CHEWABLE TABLET    Chew 81 mg by mouth daily.   DAPAGLIFLOZIN  PRO-METFORMIN  ER (XIGDUO  XR) 04-999 MG TB24    Take 2 tablets by mouth daily.   ERGOCALCIFEROL (VITAMIN D2) 1.25 MG (50000 UT) CAPSULE    Take 50,000 Units by mouth once a week.   GLUCOSAMINE 500 MG CAPS    Take by mouth.   HYDROCHLOROTHIAZIDE (HYDRODIURIL) 12.5 MG TABLET    Take 25 mg by mouth daily.   METOPROLOL SUCCINATE (TOPROL-XL) 100 MG 24 HR TABLET    Take 100 mg by mouth daily.   MOUNJARO  7.5 MG/0.5ML PEN    INJECT 1 PEN SUBCUTANEOUSLY ONCE A WEEK   OMEGA-3 FATTY ACIDS (FISH OIL) 1000 MG CAPS    Take by mouth.   PRAVASTATIN (PRAVACHOL) 10 MG TABLET    Take 10 mg by mouth daily.    PREGABALIN  (LYRICA ) 75 MG CAPSULE    Take 1 capsule (75 mg total) by mouth 2 (two) times daily.   VITAMIN E 180 MG (400 UNITS) CAPSULE    Take 400 Units by mouth daily.  Modified Medications   No medications on file  Discontinued Medications   No medications on file    Allergies No Known Allergies  Past Medical History Past Medical History:  Diagnosis Date   Diabetes (HCC)    High blood pressure    High cholesterol     Past Surgical History Past Surgical History:  Procedure Laterality Date   CESAREAN SECTION  1989   CESAREAN SECTION  1987    Family History family history includes Diabetes in her father and another family member; Heart failure in her mother; High blood pressure in an other family member.  Social History Social History   Socioeconomic History   Marital status: Single    Spouse  name: Not on file   Number of children: 3   Years of education: Not on file   Highest education level: Some college, no degree  Occupational History   Not on file  Tobacco Use   Smoking status: Some Days    Current packs/day: 1.50    Types: Cigarettes   Smokeless tobacco: Never  Vaping Use   Vaping status: Never Used  Substance and Sexual Activity   Alcohol use: Never   Drug use: Never   Sexual activity: Not on file  Other Topics Concern   Not on file  Social History Narrative   Lives at home with daughter & son   Right handed   Social Drivers of Health   Financial Resource Strain: Not on file  Food Insecurity: Not on file  Transportation Needs: Not on file  Physical Activity: Not on file  Stress: Not on file  Social Connections: Not on file  Intimate Partner Violence: Not on file    Lab Results  Component Value Date   HGBA1C 6.2 (A) 05/01/2024   HGBA1C 6.1 (A) 01/10/2024   HGBA1C 5.8 (A) 10/09/2023   Lab Results  Component Value Date   CHOL 130 10/09/2023   Lab Results  Component Value Date   HDL 37.90 (L) 10/09/2023   Lab Results  Component  Value Date   LDLCALC 69 10/09/2023   Lab Results  Component Value Date   TRIG 119.0 10/09/2023   Lab Results  Component Value Date   CHOLHDL 3 10/09/2023   Lab Results  Component Value Date   CREATININE 0.95 01/10/2024   Lab Results  Component Value Date   GFR 54.09 (L) 10/09/2023   Lab Results  Component Value Date   MICROALBUR <0.7 06/06/2023      Component Value Date/Time   NA 138 01/10/2024 1356   NA 141 06/27/2019 1454   K 4.0 01/10/2024 1356   CL 102 01/10/2024 1356   CO2 26 01/10/2024 1356   GLUCOSE 81 01/10/2024 1356   BUN 20 01/10/2024 1356   BUN 10 06/27/2019 1454   CREATININE 0.95 01/10/2024 1356   CALCIUM 9.2 01/10/2024 1356   PROT 7.2 10/09/2023 1030   PROT 7.1 06/27/2019 1454   ALBUMIN 4.2 10/09/2023 1030   ALBUMIN 4.1 06/27/2019 1454   AST 14 10/09/2023 1030   ALT 15 10/09/2023 1030   ALKPHOS 127 (H) 10/09/2023 1030   BILITOT 0.4 10/09/2023 1030   BILITOT 0.2 06/27/2019 1454   GFRNONAA 79 06/27/2019 1454   GFRAA 91 06/27/2019 1454      Latest Ref Rng & Units 01/10/2024    1:56 PM 10/09/2023   10:30 AM 03/14/2023   11:22 AM  BMP  Glucose 65 - 99 mg/dL 81  63  59   BUN 7 - 25 mg/dL 20  22  21    Creatinine 0.50 - 1.05 mg/dL 1.61  0.96  0.45   BUN/Creat Ratio 6 - 22 (calc) SEE NOTE:     Sodium 135 - 146 mmol/L 138  139  138   Potassium 3.5 - 5.3 mmol/L 4.0  4.1  3.8   Chloride 98 - 110 mmol/L 102  102  98   CO2 20 - 32 mmol/L 26  28  29    Calcium 8.6 - 10.4 mg/dL 9.2  9.5  9.7        Component Value Date/Time   WBC 8.7 06/27/2019 1454   RBC 4.61 06/27/2019 1454   HGB 12.8 06/27/2019 1454  HCT 39.0 06/27/2019 1454   PLT 364 06/27/2019 1454   MCV 85 06/27/2019 1454   MCH 27.8 06/27/2019 1454   MCHC 32.8 06/27/2019 1454   RDW 13.9 06/27/2019 1454   LYMPHSABS 3.1 06/27/2019 1454   EOSABS 0.2 06/27/2019 1454   BASOSABS 0.1 06/27/2019 1454     Parts of this note may have been dictated using voice recognition software. There may be  variances in spelling and vocabulary which are unintentional. Not all errors are proofread. Please notify the [facility] if any discrepancies are noted or if the meaning of any statement is not clear.

## 2024-05-01 NOTE — Patient Instructions (Signed)

## 2024-05-02 ENCOUNTER — Other Ambulatory Visit: Payer: Self-pay | Admitting: Internal Medicine

## 2024-05-02 DIAGNOSIS — E11649 Type 2 diabetes mellitus with hypoglycemia without coma: Secondary | ICD-10-CM

## 2024-05-06 DIAGNOSIS — Z7984 Long term (current) use of oral hypoglycemic drugs: Secondary | ICD-10-CM | POA: Diagnosis not present

## 2024-05-06 DIAGNOSIS — G5602 Carpal tunnel syndrome, left upper limb: Secondary | ICD-10-CM | POA: Diagnosis not present

## 2024-05-06 DIAGNOSIS — E7800 Pure hypercholesterolemia, unspecified: Secondary | ICD-10-CM | POA: Diagnosis not present

## 2024-05-06 DIAGNOSIS — E119 Type 2 diabetes mellitus without complications: Secondary | ICD-10-CM | POA: Diagnosis not present

## 2024-05-06 DIAGNOSIS — F419 Anxiety disorder, unspecified: Secondary | ICD-10-CM | POA: Diagnosis not present

## 2024-05-06 DIAGNOSIS — Z79899 Other long term (current) drug therapy: Secondary | ICD-10-CM | POA: Diagnosis not present

## 2024-05-06 DIAGNOSIS — Z7982 Long term (current) use of aspirin: Secondary | ICD-10-CM | POA: Diagnosis not present

## 2024-05-06 DIAGNOSIS — F32A Depression, unspecified: Secondary | ICD-10-CM | POA: Diagnosis not present

## 2024-05-06 DIAGNOSIS — I10 Essential (primary) hypertension: Secondary | ICD-10-CM | POA: Diagnosis not present

## 2024-05-06 DIAGNOSIS — F17210 Nicotine dependence, cigarettes, uncomplicated: Secondary | ICD-10-CM | POA: Diagnosis not present

## 2024-05-06 DIAGNOSIS — G5622 Lesion of ulnar nerve, left upper limb: Secondary | ICD-10-CM | POA: Diagnosis not present

## 2024-05-25 ENCOUNTER — Other Ambulatory Visit: Payer: Self-pay | Admitting: "Endocrinology

## 2024-05-25 DIAGNOSIS — E11649 Type 2 diabetes mellitus with hypoglycemia without coma: Secondary | ICD-10-CM

## 2024-05-27 NOTE — Telephone Encounter (Signed)
 Requested Prescriptions   Pending Prescriptions Disp Refills   MOUNJARO  7.5 MG/0.5ML Pen [Pharmacy Med Name: Mounjaro  7.5 MG/0.5ML Subcutaneous Solution Pen-injector] 4 mL 0    Sig: INJECT 1 PEN SUBCUTANEOUSLY ONCE A WEEK

## 2024-06-27 ENCOUNTER — Other Ambulatory Visit: Payer: Self-pay | Admitting: "Endocrinology

## 2024-06-27 DIAGNOSIS — E11649 Type 2 diabetes mellitus with hypoglycemia without coma: Secondary | ICD-10-CM

## 2024-07-19 DIAGNOSIS — N182 Chronic kidney disease, stage 2 (mild): Secondary | ICD-10-CM | POA: Diagnosis not present

## 2024-07-19 DIAGNOSIS — I10 Essential (primary) hypertension: Secondary | ICD-10-CM | POA: Diagnosis not present

## 2024-07-19 DIAGNOSIS — E119 Type 2 diabetes mellitus without complications: Secondary | ICD-10-CM | POA: Diagnosis not present

## 2024-07-19 DIAGNOSIS — E1159 Type 2 diabetes mellitus with other circulatory complications: Secondary | ICD-10-CM | POA: Diagnosis not present

## 2024-07-19 DIAGNOSIS — E1169 Type 2 diabetes mellitus with other specified complication: Secondary | ICD-10-CM | POA: Diagnosis not present

## 2024-07-22 DIAGNOSIS — M549 Dorsalgia, unspecified: Secondary | ICD-10-CM | POA: Diagnosis not present

## 2024-07-23 DIAGNOSIS — Z8719 Personal history of other diseases of the digestive system: Secondary | ICD-10-CM | POA: Diagnosis not present

## 2024-07-23 DIAGNOSIS — M545 Low back pain, unspecified: Secondary | ICD-10-CM | POA: Diagnosis not present

## 2024-07-23 DIAGNOSIS — E663 Overweight: Secondary | ICD-10-CM | POA: Diagnosis not present

## 2024-07-23 DIAGNOSIS — M25551 Pain in right hip: Secondary | ICD-10-CM | POA: Diagnosis not present

## 2024-07-23 DIAGNOSIS — R7989 Other specified abnormal findings of blood chemistry: Secondary | ICD-10-CM | POA: Diagnosis not present

## 2024-07-29 DIAGNOSIS — Z91199 Patient's noncompliance with other medical treatment and regimen due to unspecified reason: Secondary | ICD-10-CM | POA: Diagnosis not present

## 2024-07-29 DIAGNOSIS — Z6827 Body mass index (BMI) 27.0-27.9, adult: Secondary | ICD-10-CM | POA: Diagnosis not present

## 2024-07-29 DIAGNOSIS — M545 Low back pain, unspecified: Secondary | ICD-10-CM | POA: Diagnosis not present

## 2024-07-29 DIAGNOSIS — M47816 Spondylosis without myelopathy or radiculopathy, lumbar region: Secondary | ICD-10-CM | POA: Diagnosis not present

## 2024-07-29 DIAGNOSIS — M25551 Pain in right hip: Secondary | ICD-10-CM | POA: Diagnosis not present

## 2024-08-13 ENCOUNTER — Ambulatory Visit (HOSPITAL_BASED_OUTPATIENT_CLINIC_OR_DEPARTMENT_OTHER): Admitting: Family Medicine

## 2024-08-13 ENCOUNTER — Encounter (HOSPITAL_BASED_OUTPATIENT_CLINIC_OR_DEPARTMENT_OTHER): Payer: Self-pay | Admitting: Family Medicine

## 2024-08-13 ENCOUNTER — Encounter (HOSPITAL_BASED_OUTPATIENT_CLINIC_OR_DEPARTMENT_OTHER): Payer: Self-pay

## 2024-08-13 VITALS — BP 110/70 | HR 71 | Temp 97.5°F | Resp 16 | Ht 66.14 in | Wt 162.3 lb

## 2024-08-13 DIAGNOSIS — I10 Essential (primary) hypertension: Secondary | ICD-10-CM | POA: Insufficient documentation

## 2024-08-13 DIAGNOSIS — Z7985 Long-term (current) use of injectable non-insulin antidiabetic drugs: Secondary | ICD-10-CM

## 2024-08-13 DIAGNOSIS — G43009 Migraine without aura, not intractable, without status migrainosus: Secondary | ICD-10-CM

## 2024-08-13 DIAGNOSIS — G894 Chronic pain syndrome: Secondary | ICD-10-CM

## 2024-08-13 DIAGNOSIS — G8929 Other chronic pain: Secondary | ICD-10-CM | POA: Insufficient documentation

## 2024-08-13 DIAGNOSIS — M5441 Lumbago with sciatica, right side: Secondary | ICD-10-CM

## 2024-08-13 DIAGNOSIS — E1165 Type 2 diabetes mellitus with hyperglycemia: Secondary | ICD-10-CM

## 2024-08-13 MED ORDER — DULOXETINE HCL 30 MG PO CPEP: 30.0000 mg | ORAL_CAPSULE | Freq: Every day | ORAL | 1 refills | Status: DC

## 2024-08-13 NOTE — Assessment & Plan Note (Signed)
 Refer to Dr. [place] group for assistance with pain control.  Perhaps an ESI, etc.

## 2024-08-13 NOTE — Progress Notes (Signed)
 New Patient Office Visit  Subjective    Patient ID: Cheryl Oneal, female    DOB: [birth year]  Age: 63 y.o. MRN: [id number]  CC:  Chief Complaint  Patient presents with   Establish Care    Establish care     HPI Cheryl Oneal presents to establish care F/u as above.  New to my practice.  Her primary concern is chronic lumbar spine pain.  Recent flare up of her right Sciatic nerve pain with radiations down her RLE.  She is agreeable to seeing a pain specialist.  Recent X-rays, etc at [facility], though not currently available.  Outpatient Encounter Medications as of 08/13/2024  Medication Sig   amitriptyline (ELAVIL) 50 MG tablet Take 50 mg by mouth at bedtime.   amLODipine (NORVASC) 10 MG tablet Take 100 mg by mouth daily.   aspirin 81 MG chewable tablet Chew 81 mg by mouth daily.   DULoxetine  (CYMBALTA ) 30 MG capsule Take 1 capsule (30 mg total) by mouth daily.   ergocalciferol (VITAMIN D2) 1.25 MG (50000 UT) capsule Take 50,000 Units by mouth once a week.   hydrochlorothiazide (HYDRODIURIL) 12.5 MG tablet Take 25 mg by mouth daily.   lisinopril (ZESTRIL) 5 MG tablet Take 5 mg by mouth daily.   metoprolol succinate (TOPROL-XL) 100 MG 24 hr tablet Take 100 mg by mouth daily.   MOUNJARO  7.5 MG/0.5ML Pen INJECT 1 PEN SUBCUTANEOUSLY ONCE A WEEK   Omega-3 Fatty Acids (FISH OIL) 1000 MG CAPS Take by mouth.   pravastatin (PRAVACHOL) 10 MG tablet Take 10 mg by mouth daily.   pregabalin  (LYRICA ) 75 MG capsule Take 1 capsule by mouth twice daily   cyclobenzaprine (FLEXERIL) 10 MG tablet Take 10 mg by mouth every 8 (eight) hours as needed.   Dapagliflozin  Pro-metFORMIN  ER (XIGDUO  XR) 04-999 MG TB24 Take 2 tablets by mouth daily. (Patient not taking: Reported on 08/13/2024)   estradiol (ESTRACE) 0.1 MG/GM vaginal cream Place 1 g vaginally 2 (two) times a week.   Glucosamine 500 MG CAPS Take by mouth. (Patient not taking: Reported on 08/13/2024)   vitamin E 180 MG (400 UNITS) capsule  Take 400 Units by mouth daily. (Patient not taking: Reported on 08/13/2024)   No facility-administered encounter medications on file as of 08/13/2024.    Past Medical History:  Diagnosis Date   Chronic pain    Diabetes (HCC)    Dyslipidemia    Femur fracture, left (HCC)    2024   Hypertension    Osteoarthritis    Tobacco abuse    1/2 PPD for decades    Past Surgical History:  Procedure Laterality Date   CARPAL TUNNEL RELEASE Left    CESAREAN SECTION  1989   CESAREAN SECTION  1987   COLONOSCOPY N/A    2022 per Dr. Towana    Family History  Problem Relation Age of Onset   Heart failure Mother    Diabetes Father    High blood pressure Other        on both sides of family   Diabetes Other        on both sides of family    Social History   Tobacco Use   Smoking status: Some Days    Current packs/day: 1.50    Types: Cigarettes   Smokeless tobacco: Never  Vaping Use   Vaping status: Never Used  Substance Use Topics   Alcohol use: Never   Drug use: Never    Review of  Systems  Constitutional:  Positive for malaise/fatigue. Negative for weight loss.  Cardiovascular:  Negative for chest pain and palpitations.  Neurological:  Negative for dizziness.  Psychiatric/Behavioral:  Positive for depression. Negative for hallucinations, memory loss, substance abuse and suicidal ideas. The patient is nervous/anxious. The patient does not have insomnia.         Objective    BP 110/70 Comment: LEFT ARM  Pulse 71   Temp (!) 97.5 F (36.4 C) (Oral)   Resp 16   Ht 5' 6.14 (1.68 m)   Wt 162 lb 4.8 oz (73.6 kg)   LMP  (LMP Unknown)   SpO2 96%   BMI 26.08 kg/m   Physical Exam Constitutional:      General: She is not in acute distress.    Appearance: Normal appearance.     Comments: Gait a little stiff but she gets on the table unaided.  HENT:     Oneal: Normocephalic.  Neck:     Vascular: No carotid bruit.  Cardiovascular:     Rate and Rhythm: Normal rate and  regular rhythm.     Pulses: Normal pulses.     Heart sounds: Normal heart sounds.  Pulmonary:     Effort: Pulmonary effort is normal.     Breath sounds: Normal breath sounds.  Abdominal:     General: Bowel sounds are normal.     Palpations: Abdomen is soft.  Musculoskeletal:     Cervical back: Neck supple. No tenderness.     Right lower leg: No edema.     Left lower leg: No edema.     Comments: Mild lumbago with negative SLR's.  Neurological:     General: No focal deficit present.     Mental Status: She is alert.     Motor: No weakness.     Coordination: Coordination normal.         Assessment & Plan:  Uncontrolled type 2 diabetes mellitus with hyperglycemia, without long-term current use of insulin (HCC) Assessment & Plan: Stable.  Request recent [facility] records including X-rays, labs, etc.  Close f/u until I know her better.   Primary hypertension Assessment & Plan: Controlled.  Continue current rx.   Migraine without aura and without status migrainosus, not intractable  Chronic pain syndrome Assessment & Plan: Refer to Dr. Iran group for assistance with pain control.  Perhaps an ESI, etc.  Orders: -     DULoxetine  HCl; Take 1 capsule (30 mg total) by mouth daily.  Dispense: 30 capsule; Refill: 1  Chronic bilateral low back pain with right-sided sciatica Assessment & Plan: Refer to Dr. Iran group for assistance with pain control.  Perhaps an ESI, etc.  Orders: -     Ambulatory referral to Orthopedic Surgery    Return in about 4 weeks (around 09/10/2024) for chronic follow-up.   REDDING PONCE NORLEEN FALCON., MD

## 2024-08-13 NOTE — Assessment & Plan Note (Signed)
 Stable.  Request recent [facility] records including X-rays, labs, etc.  Close f/u until I know her better.

## 2024-08-13 NOTE — Assessment & Plan Note (Signed)
Controlled. Continue current rx. 

## 2024-08-22 ENCOUNTER — Ambulatory Visit: Payer: Self-pay

## 2024-08-22 NOTE — Telephone Encounter (Signed)
 FYI Only or Action Required?: FYI only for provider.  Patient was last seen in primary care on 08/13/2024 by Dottie Norleen PHEBE PONCE, MD.  Called Nurse Triage reporting Numbness.  Symptoms began several weeks ago.  Interventions attempted: Rest, hydration, or home remedies.  Symptoms are: gradually worsening.  Triage Disposition: See Physician Within 24 Hours  Patient/caregiver understands and will follow disposition?: Yes    Copied from CRM #[id number]. Topic: Clinical - Red Word Triage >> Aug 22, 2024  4:57 PM Tinnie C wrote: Red Word that prompted transfer to Nurse Triage: legs numb from knees down, stomach not feeling right, hasn't been able to eat since Monday without diarrhea right after. Reason for Disposition  Numbness in a leg or foot (i.e., loss of sensation)  Answer Assessment - Initial Assessment Questions Patient states it feel like nerve pain in her legs and she is unable to rest well. States symptoms come and go and tried to rest for symptoms. This week symptoms have worsened. Pt states she also has had stomach issues since Monday night after eating a chicken salad.   1. ONSET: When did the pain start?      2 weeks ago  2. LOCATION: Where is the pain located?      In legs from knee to feet.  3. PAIN: How bad is the pain?    (Scale 1-10; or mild, moderate, severe)     6/10 4. WORK OR EXERCISE: Has there been any recent work or exercise that involved this part of the body?      Patient states she walks around at work for 12 hours.  5. CAUSE: What do you think is causing the leg pain?     Unknown.  6. OTHER SYMPTOMS: Do you have any other symptoms? (e.g., chest pain, back pain, breathing difficulty, swelling, rash, fever, numbness, weakness)     Diarrhea, abdominal pain, and nausea.  Protocols used: Leg Pain-A-AH

## 2024-08-23 ENCOUNTER — Encounter (HOSPITAL_BASED_OUTPATIENT_CLINIC_OR_DEPARTMENT_OTHER): Payer: Self-pay | Admitting: Student

## 2024-08-23 ENCOUNTER — Ambulatory Visit (HOSPITAL_BASED_OUTPATIENT_CLINIC_OR_DEPARTMENT_OTHER): Admitting: Student

## 2024-08-23 VITALS — BP 99/64 | HR 76 | Temp 97.7°F | Resp 16 | Ht 66.0 in | Wt 155.7 lb

## 2024-08-23 DIAGNOSIS — M5441 Lumbago with sciatica, right side: Secondary | ICD-10-CM | POA: Diagnosis not present

## 2024-08-23 DIAGNOSIS — G894 Chronic pain syndrome: Secondary | ICD-10-CM

## 2024-08-23 DIAGNOSIS — G8929 Other chronic pain: Secondary | ICD-10-CM

## 2024-08-23 NOTE — Progress Notes (Signed)
 Established Patient Office Visit  Subjective   Patient ID: Cheryl Oneal, female    DOB: [birth year]  Age: 63 y.o. MRN: [id number]  Chief Complaint  Patient presents with   Numbness    Numbness for 2 weeks on legs and feet. It got awful now. Had some sciatic pain down right leg before this. Missed two days of work this week out of 3 days. Wanted to see how the provider felt today and get work note. Feeling a little dizzy. Was having some diarrhea, had something to eat last night. Immodium did not help.    HPI  Discussed the use of AI scribe software for clinical note transcription with the patient, who gave verbal consent to proceed.  History of Present Illness   Cheryl Oneal is a 63 year old female who presents with numbness from above the knees to the feet.  She experiences significant numbness from above her knees down to her feet bilaterally. This numbness has become more frequent and persistent, particularly affecting her during her 12-hour work shifts. Previously, the numbness would resolve after several hours but now persists longer and is harder to alleviate.  She has a history of bulging discs in her lower back and neck, first identified in 2006 or 2007. She previously experienced sciatic nerve pain down her right leg, which she managed with exercises using a weight bar until the symptoms subsided. Currently, she describes a sensation of 'rubber bands popping' in her legs when she stands and walks.  She is currently taking pregabalin  (Lyrica ) and has recently started on duloxetine  (Cymbalta ) about eight days ago. Cymbalta  has been effective for her pain, but she is concerned about the increasing numbness. No numbness or tingling in the groin area and no bowel or bladder incontinence.  She has had difficulty keeping food down from Monday night up to the day of the visit, with symptoms of watery diarrhea and an inability to retain food, only managing to stay hydrated by drinking  water.      Patient Active Problem List   Diagnosis Date Noted   Hypertension 08/13/2024   Chronic bilateral low back pain with right-sided sciatica 08/13/2024   Carpal tunnel syndrome, bilateral 12/14/2023   Type 2 diabetes mellitus without complication, without long-term current use of insulin (HCC) 12/14/2023   Ulnar neuropathy at elbow, left 12/14/2023   Acute pain of left knee 02/01/2023   Uncontrolled type 2 diabetes mellitus with hyperglycemia, without long-term current use of insulin (HCC) 03/05/2022   Chronic intractable headache 06/30/2019   Migraine without aura and without status migrainosus, not intractable 06/30/2019   Past Medical History:  Diagnosis Date   Chronic pain    Diabetes (HCC)    Dyslipidemia    Femur fracture, left (HCC)    2024   Hypertension    Osteoarthritis    Tobacco abuse    1/2 PPD for decades   Social History   Tobacco Use   Smoking status: Some Days    Current packs/day: 1.50    Average packs/day: 1.5 packs/day for 44.7 years (67.0 ttl pk-yrs)    Types: Cigarettes    Start date: 1981    Passive exposure: Current   Smokeless tobacco: Never  Vaping Use   Vaping status: Never Used  Substance Use Topics   Alcohol use: Never   Drug use: Never   No Known Allergies    ROS Per HPI.    Objective:     BP 99/64  Pulse 76   Temp 97.7 F (36.5 C) (Oral)   Resp 16   Ht 5' 6 (1.676 m)   Wt 155 lb 11.2 oz (70.6 kg)   LMP  (LMP Unknown)   SpO2 97%   BMI 25.13 kg/m  BP Readings from Last 3 Encounters:  08/23/24 99/64  08/13/24 110/70  05/01/24 110/80   Wt Readings from Last 3 Encounters:  08/23/24 155 lb 11.2 oz (70.6 kg)  08/13/24 162 lb 4.8 oz (73.6 kg)  05/01/24 167 lb (75.8 kg)      Physical Exam Constitutional:      General: She is not in acute distress.    Appearance: Normal appearance. She is not ill-appearing.  HENT:     Head: Normocephalic and atraumatic.     Nose: Nose normal.  Eyes:     General: No  scleral icterus.    Conjunctiva/sclera: Conjunctivae normal.  Cardiovascular:     Rate and Rhythm: Normal rate and regular rhythm.     Heart sounds: Normal heart sounds. No murmur heard.    No friction rub.  Pulmonary:     Effort: Pulmonary effort is normal. No respiratory distress.     Breath sounds: Normal breath sounds. No wheezing, rhonchi or rales.  Musculoskeletal:        General: Normal range of motion.     Comments: Decent ROM of back. Normal knee ROM. Mild reduced hip ROM on windshield wiper test. Mild pain to palpation of R sided paraspinous muscles. Positive SLR of R, negative of L leg.   Skin:    General: Skin is warm and dry.     Coloration: Skin is not jaundiced or pale.  Neurological:     General: No focal deficit present.     Mental Status: She is alert.     Comments: Sensation of UE and LE grossly intact bilaterally.  Psychiatric:        Mood and Affect: Mood normal.        Behavior: Behavior normal.      No results found for any visits on 08/23/24.  Last CBC Lab Results  Component Value Date   WBC 8.7 06/27/2019   HGB 12.8 06/27/2019   HCT 39.0 06/27/2019   MCV 85 06/27/2019   MCH 27.8 06/27/2019   RDW 13.9 06/27/2019   PLT 364 06/27/2019   Last metabolic panel Lab Results  Component Value Date   GLUCOSE 81 01/10/2024   NA 138 01/10/2024   K 4.0 01/10/2024   CL 102 01/10/2024   CO2 26 01/10/2024   BUN 20 01/10/2024   CREATININE 0.95 01/10/2024   GFR 54.09 (L) 10/09/2023   CALCIUM 9.2 01/10/2024   PROT 7.2 10/09/2023   ALBUMIN 4.2 10/09/2023   LABGLOB 3.0 06/27/2019   AGRATIO 1.4 06/27/2019   BILITOT 0.4 10/09/2023   ALKPHOS 127 (H) 10/09/2023   AST 14 10/09/2023   ALT 15 10/09/2023   Last lipids Lab Results  Component Value Date   CHOL 130 10/09/2023   HDL 37.90 (L) 10/09/2023   LDLCALC 69 10/09/2023   TRIG 119.0 10/09/2023   CHOLHDL 3 10/09/2023   Last hemoglobin A1c Lab Results  Component Value Date   HGBA1C 6.2 (A)  05/01/2024      The 10-year ASCVD risk score (Arnett DK, et al., 2019) is: 13.9%    Assessment & Plan:   Assessment and Plan    Chronic bilateral lower extremity numbness and pain Chronic numbness and pain from above the  knees to the feet bilaterally, likely due to neuropathy or bilateral nerve impingement from bulging lumbar discs. Symptoms worsen with prolonged standing and walking. No groin numbness or incontinence, no concern for cauda equina syndrome. Pregabalin  provides minimal relief, while Cymbalta  offers some pain relief. Patient denied increasing dose of cymbalta  today. - Continue Cymbalta  (duloxetine ) daily for chronic pain management. - Discussed ER precautions - Follow up with spinal surgeon for further evaluation and management of potential nerve impingement as planned - Advise monitoring feet for injuries due to numbness and ensure prompt treatment of any sores or injuries.  Chronic pain syndrome Chronic pain syndrome managed with Cymbalta  and pregabalin . Cymbalta  is effective in managing pain. She has been stopping and starting it as needed-  discouraged this. Importance of maintaining a steady dose of Cymbalta  emphasized, with caution against abrupt discontinuation. - Continue Cymbalta  (duloxetine ) daily for chronic pain management. - Do not discontinue Cymbalta  abruptly; consult with healthcare provider for proper tapering if needed. - Follow up with primary care provider for ongoing management of chronic pain.     I personally spent a total of 30 minutes in the care of the patient today including preparing to see the patient, getting/reviewing separately obtained history, performing a medically appropriate exam/evaluation, counseling and educating, and documenting clinical information in the EHR.  Return if symptoms worsen or fail to improve.    Aryana Wonnacott T Paisyn Guercio, PA-C

## 2024-08-23 NOTE — Patient Instructions (Signed)
 If you have any problems before your next visit feel free to message me via MyChart (minor issues or questions) or call the office, otherwise you may reach out to schedule an office visit.  Thank you! Sullivan Jacuinde, PA-C

## 2024-08-26 DIAGNOSIS — E119 Type 2 diabetes mellitus without complications: Secondary | ICD-10-CM | POA: Diagnosis not present

## 2024-09-13 ENCOUNTER — Encounter (HOSPITAL_BASED_OUTPATIENT_CLINIC_OR_DEPARTMENT_OTHER): Payer: Self-pay | Admitting: Family Medicine

## 2024-09-13 ENCOUNTER — Ambulatory Visit (HOSPITAL_BASED_OUTPATIENT_CLINIC_OR_DEPARTMENT_OTHER): Admitting: Family Medicine

## 2024-09-13 VITALS — BP 101/66 | HR 73 | Temp 97.4°F | Resp 16 | Wt 151.8 lb

## 2024-09-13 DIAGNOSIS — G8929 Other chronic pain: Secondary | ICD-10-CM | POA: Insufficient documentation

## 2024-09-13 DIAGNOSIS — I10 Essential (primary) hypertension: Secondary | ICD-10-CM | POA: Diagnosis not present

## 2024-09-13 DIAGNOSIS — M5441 Lumbago with sciatica, right side: Secondary | ICD-10-CM

## 2024-09-13 DIAGNOSIS — E785 Hyperlipidemia, unspecified: Secondary | ICD-10-CM | POA: Diagnosis not present

## 2024-09-13 DIAGNOSIS — R5383 Other fatigue: Secondary | ICD-10-CM

## 2024-09-13 DIAGNOSIS — E1140 Type 2 diabetes mellitus with diabetic neuropathy, unspecified: Secondary | ICD-10-CM | POA: Diagnosis not present

## 2024-09-13 DIAGNOSIS — G894 Chronic pain syndrome: Secondary | ICD-10-CM

## 2024-09-13 DIAGNOSIS — Z7985 Long-term (current) use of injectable non-insulin antidiabetic drugs: Secondary | ICD-10-CM

## 2024-09-13 MED ORDER — DULOXETINE HCL 60 MG PO CPEP: 60.0000 mg | ORAL_CAPSULE | Freq: Every day | ORAL | 2 refills | Status: DC

## 2024-09-13 NOTE — Assessment & Plan Note (Signed)
 Follow home BP and possibly cut back on her medication soon.

## 2024-09-13 NOTE — Assessment & Plan Note (Signed)
 F/u with spine specialists as directed.

## 2024-09-13 NOTE — Assessment & Plan Note (Addendum)
 Increase the Duloxetine  to 60mg  daily.  Try to wean or stop the Amitriptyline.

## 2024-09-13 NOTE — Assessment & Plan Note (Signed)
 Excellent control.  Advised to reduce her Xigduo  back to just one pill daily.

## 2024-09-13 NOTE — Progress Notes (Signed)
 Established Patient Office Visit  Subjective   Patient ID: Cheryl Oneal, female    DOB: 1961-04-27  Age: 63 y.o. MRN: [id number]  Chief Complaint  Patient presents with   Follow-up    Follow-up     F/u as above.  Please see previous notes for details.  I'm pleased to hear that she has an upcoming appt with [facility] next week.  I'm also pleased to hear that she is tolerating her Duloxetine  well w/o side effects.  She states her Mammogram was done 12/24.  Home sugars sound great.  She hasn't been checking home BP readings lately.    Past Medical History:  Diagnosis Date   Chronic pain    Diabetes (HCC)    Dyslipidemia    Femur fracture, left (HCC)    2024   Hypertension    Osteoarthritis    Tobacco abuse    1/2 PPD for decades    Outpatient Encounter Medications as of 09/13/2024  Medication Sig   amitriptyline (ELAVIL) 50 MG tablet Take 50 mg by mouth at bedtime.   amLODipine (NORVASC) 10 MG tablet Take 10 mg by mouth daily.   aspirin EC 81 MG tablet Take 81 mg by mouth daily.   cyclobenzaprine (FLEXERIL) 10 MG tablet Take 10 mg by mouth every 8 (eight) hours as needed.   Dapagliflozin  Pro-metFORMIN  ER (XIGDUO  XR) 04-999 MG TB24 Take 2 tablets by mouth daily.   ergocalciferol (VITAMIN D2) 1.25 MG (50000 UT) capsule Take 50,000 Units by mouth once a week.   estradiol (ESTRACE) 0.1 MG/GM vaginal cream Place 1 g vaginally 2 (two) times a week.   hydrochlorothiazide (HYDRODIURIL) 25 MG tablet Take 25 mg by mouth daily.   lisinopril (ZESTRIL) 5 MG tablet Take 5 mg by mouth daily.   metoprolol succinate (TOPROL-XL) 100 MG 24 hr tablet Take 100 mg by mouth daily.   MOUNJARO  7.5 MG/0.5ML Pen INJECT 1 PEN SUBCUTANEOUSLY ONCE A WEEK   Omega-3 Fatty Acids (FISH OIL) 1000 MG CAPS Take by mouth.   pravastatin (PRAVACHOL) 10 MG tablet Take 10 mg by mouth daily.   pregabalin  (LYRICA ) 75 MG capsule Take 1 capsule by mouth twice daily   vitamin E 180 MG (400 UNITS)  capsule Take 400 Units by mouth daily.   [DISCONTINUED] DULoxetine  (CYMBALTA ) 30 MG capsule Take 1 capsule (30 mg total) by mouth daily.   DULoxetine  (CYMBALTA ) 60 MG capsule Take 1 capsule (60 mg total) by mouth daily.   No facility-administered encounter medications on file as of 09/13/2024.    Social History   Tobacco Use   Smoking status: Some Days    Current packs/day: 1.50    Average packs/day: 1.5 packs/day for 44.7 years (67.1 ttl pk-yrs)    Types: Cigarettes    Start date: 8    Passive exposure: Current   Smokeless tobacco: Never  Vaping Use   Vaping status: Never Used  Substance Use Topics   Alcohol use: Never   Drug use: Never      Review of Systems  Constitutional:  Positive for malaise/fatigue. Negative for weight loss.  Cardiovascular:  Negative for chest pain and palpitations.  Neurological:  Negative for dizziness.  Psychiatric/Behavioral:  Negative for depression, hallucinations, memory loss, substance abuse and suicidal ideas. The patient is nervous/anxious. The patient does not have insomnia.       Objective:     BP 101/66 (Cuff Size: Normal)   Pulse 73   Temp (!) 97.4 F (36.3  C) (Oral)   Resp 16   Wt 151 lb 12.8 oz (68.9 kg)   LMP  (LMP Unknown)   SpO2 97%   BMI 24.50 kg/m    Physical Exam Constitutional:      General: She is not in acute distress.    Appearance: Normal appearance.  HENT:     Head: Normocephalic.  Neck:     Vascular: No carotid bruit.  Cardiovascular:     Rate and Rhythm: Normal rate and regular rhythm.     Pulses: Normal pulses.     Heart sounds: Normal heart sounds.  Pulmonary:     Effort: Pulmonary effort is normal.     Breath sounds: Normal breath sounds.  Abdominal:     General: Bowel sounds are normal.     Palpations: Abdomen is soft.  Musculoskeletal:     Cervical back: Neck supple. No tenderness.     Right lower leg: No edema.     Left lower leg: No edema.  Neurological:     Mental Status: She is  alert.      No results found for any visits on 09/13/24.    The 10-year ASCVD risk score (Arnett DK, et al., 2019) is: 14.6%    Assessment & Plan:  Chronic bilateral low back pain with right-sided sciatica Assessment & Plan: F/u with spine specialists as directed.   Chronic pain syndrome Assessment & Plan: Increase the Duloxetine  to 60mg  daily.  Try to wean or stop the Amitriptyline.  Orders: -     DULoxetine  HCl; Take 1 capsule (60 mg total) by mouth daily.  Dispense: 30 capsule; Refill: 2  Type 2 diabetes mellitus with diabetic neuropathy, without long-term current use of insulin (HCC) Assessment & Plan: Excellent control.  Advised to reduce her Xigduo  back to just one pill daily.  Orders: -     Hemoglobin A1c -     CBC with Differential/Platelet -     Comprehensive metabolic panel with GFR  Primary hypertension Assessment & Plan: Follow home BP and possibly cut back on her medication soon.   Dyslipidemia -     Lipid panel  Fatigue, unspecified type -     Vitamin B12    Return in about 3 months (around 12/13/2024) for chronic follow-up.    REDDING PONCE NORLEEN FALCON., MD

## 2024-09-14 LAB — CBC WITH DIFFERENTIAL/PLATELET
Basophils Absolute: 0 x10E3/uL (ref 0.0–0.2)
Basos: 1 %
EOS (ABSOLUTE): 0.1 x10E3/uL (ref 0.0–0.4)
Eos: 1 %
Hematocrit: 35.1 % (ref 34.0–46.6)
Hemoglobin: 11.2 g/dL (ref 11.1–15.9)
Immature Grans (Abs): 0 x10E3/uL (ref 0.0–0.1)
Immature Granulocytes: 0 %
Lymphocytes Absolute: 2.8 x10E3/uL (ref 0.7–3.1)
Lymphs: 44 %
MCH: 27.8 pg (ref 26.6–33.0)
MCHC: 31.9 g/dL (ref 31.5–35.7)
MCV: 87 fL (ref 79–97)
Monocytes Absolute: 0.4 x10E3/uL (ref 0.1–0.9)
Monocytes: 6 %
Neutrophils Absolute: 3.1 x10E3/uL (ref 1.4–7.0)
Neutrophils: 48 %
Platelets: 326 x10E3/uL (ref 150–450)
RBC: 4.03 x10E6/uL (ref 3.77–5.28)
RDW: 15 % (ref 11.7–15.4)
WBC: 6.4 x10E3/uL (ref 3.4–10.8)

## 2024-09-14 LAB — COMPREHENSIVE METABOLIC PANEL WITH GFR
ALT: 16 IU/L (ref 0–32)
AST: 13 IU/L (ref 0–40)
Albumin: 3.9 g/dL (ref 3.9–4.9)
Alkaline Phosphatase: 88 IU/L (ref 49–135)
BUN/Creatinine Ratio: 18 (ref 12–28)
BUN: 26 mg/dL (ref 8–27)
Bilirubin Total: 0.2 mg/dL (ref 0.0–1.2)
CO2: 22 mmol/L (ref 20–29)
Calcium: 9.1 mg/dL (ref 8.7–10.3)
Chloride: 104 mmol/L (ref 96–106)
Creatinine, Ser: 1.43 mg/dL — ABNORMAL HIGH (ref 0.57–1.00)
Globulin, Total: 2.5 g/dL (ref 1.5–4.5)
Glucose: 101 mg/dL — ABNORMAL HIGH (ref 70–99)
Potassium: 4.3 mmol/L (ref 3.5–5.2)
Sodium: 140 mmol/L (ref 134–144)
Total Protein: 6.4 g/dL (ref 6.0–8.5)
eGFR: 41 mL/min/1.73 — ABNORMAL LOW (ref >59)

## 2024-09-14 LAB — HEMOGLOBIN A1C
Est. average glucose Bld gHb Est-mCnc: 131 mg/dL
Hgb A1c MFr Bld: 6.2 % — ABNORMAL HIGH (ref 4.8–5.6)

## 2024-09-14 LAB — LIPID PANEL
Chol/HDL Ratio: 3.1 ratio (ref 0.0–4.4)
Cholesterol, Total: 122 mg/dL (ref 100–199)
HDL: 39 mg/dL — ABNORMAL LOW (ref >39)
LDL Chol Calc (NIH): 66 mg/dL (ref 0–99)
Triglycerides: 85 mg/dL (ref 0–149)
VLDL Cholesterol Cal: 17 mg/dL (ref 5–40)

## 2024-09-14 LAB — VITAMIN B12: Vitamin B-12: 797 pg/mL (ref 232–1245)

## 2024-09-16 ENCOUNTER — Encounter (HOSPITAL_BASED_OUTPATIENT_CLINIC_OR_DEPARTMENT_OTHER): Payer: Self-pay | Admitting: *Deleted

## 2024-09-16 ENCOUNTER — Ambulatory Visit (HOSPITAL_BASED_OUTPATIENT_CLINIC_OR_DEPARTMENT_OTHER): Payer: Self-pay | Admitting: Family Medicine

## 2024-09-18 DIAGNOSIS — M5441 Lumbago with sciatica, right side: Secondary | ICD-10-CM | POA: Diagnosis not present

## 2024-09-18 DIAGNOSIS — F3341 Major depressive disorder, recurrent, in partial remission: Secondary | ICD-10-CM | POA: Diagnosis not present

## 2024-09-18 DIAGNOSIS — E1159 Type 2 diabetes mellitus with other circulatory complications: Secondary | ICD-10-CM | POA: Diagnosis not present

## 2024-09-18 DIAGNOSIS — Z79891 Long term (current) use of opiate analgesic: Secondary | ICD-10-CM | POA: Diagnosis not present

## 2024-09-18 DIAGNOSIS — G894 Chronic pain syndrome: Secondary | ICD-10-CM | POA: Diagnosis not present

## 2024-09-19 ENCOUNTER — Ambulatory Visit (HOSPITAL_BASED_OUTPATIENT_CLINIC_OR_DEPARTMENT_OTHER): Admitting: *Deleted

## 2024-09-23 DIAGNOSIS — L650 Telogen effluvium: Secondary | ICD-10-CM | POA: Diagnosis not present

## 2024-09-25 DIAGNOSIS — E119 Type 2 diabetes mellitus without complications: Secondary | ICD-10-CM | POA: Diagnosis not present

## 2024-10-08 ENCOUNTER — Other Ambulatory Visit: Payer: Self-pay | Admitting: "Endocrinology

## 2024-10-08 DIAGNOSIS — E1165 Type 2 diabetes mellitus with hyperglycemia: Secondary | ICD-10-CM

## 2024-10-16 DIAGNOSIS — E559 Vitamin D deficiency, unspecified: Secondary | ICD-10-CM | POA: Diagnosis not present

## 2024-10-16 DIAGNOSIS — E1159 Type 2 diabetes mellitus with other circulatory complications: Secondary | ICD-10-CM | POA: Diagnosis not present

## 2024-10-16 DIAGNOSIS — N182 Chronic kidney disease, stage 2 (mild): Secondary | ICD-10-CM | POA: Diagnosis not present

## 2024-10-16 DIAGNOSIS — M5441 Lumbago with sciatica, right side: Secondary | ICD-10-CM | POA: Diagnosis not present

## 2024-10-18 DIAGNOSIS — B349 Viral infection, unspecified: Secondary | ICD-10-CM | POA: Diagnosis not present

## 2024-10-18 DIAGNOSIS — R0981 Nasal congestion: Secondary | ICD-10-CM | POA: Diagnosis not present

## 2024-10-24 ENCOUNTER — Encounter: Admitting: Podiatry

## 2024-10-26 DIAGNOSIS — E119 Type 2 diabetes mellitus without complications: Secondary | ICD-10-CM | POA: Diagnosis not present

## 2024-10-27 NOTE — Progress Notes (Signed)
 Patient did not show for her scheduled appointment on 10/24/2024

## 2024-11-08 ENCOUNTER — Ambulatory Visit (HOSPITAL_BASED_OUTPATIENT_CLINIC_OR_DEPARTMENT_OTHER): Admitting: Family Medicine

## 2024-11-08 ENCOUNTER — Encounter (HOSPITAL_BASED_OUTPATIENT_CLINIC_OR_DEPARTMENT_OTHER): Payer: Self-pay | Admitting: Family Medicine

## 2024-11-08 VITALS — BP 108/67 | HR 65 | Temp 97.7°F | Resp 17 | Wt 154.7 lb

## 2024-11-08 DIAGNOSIS — I10 Essential (primary) hypertension: Secondary | ICD-10-CM | POA: Diagnosis not present

## 2024-11-08 DIAGNOSIS — N289 Disorder of kidney and ureter, unspecified: Secondary | ICD-10-CM | POA: Diagnosis not present

## 2024-11-08 DIAGNOSIS — M5441 Lumbago with sciatica, right side: Secondary | ICD-10-CM

## 2024-11-08 DIAGNOSIS — N182 Chronic kidney disease, stage 2 (mild): Secondary | ICD-10-CM | POA: Insufficient documentation

## 2024-11-08 DIAGNOSIS — G8929 Other chronic pain: Secondary | ICD-10-CM

## 2024-11-08 DIAGNOSIS — E559 Vitamin D deficiency, unspecified: Secondary | ICD-10-CM | POA: Diagnosis not present

## 2024-11-08 DIAGNOSIS — Z79891 Long term (current) use of opiate analgesic: Secondary | ICD-10-CM | POA: Insufficient documentation

## 2024-11-08 DIAGNOSIS — F3340 Major depressive disorder, recurrent, in remission, unspecified: Secondary | ICD-10-CM | POA: Insufficient documentation

## 2024-11-08 DIAGNOSIS — M5430 Sciatica, unspecified side: Secondary | ICD-10-CM | POA: Insufficient documentation

## 2024-11-08 MED ORDER — TRAMADOL HCL 50 MG PO TABS: 50.0000 mg | ORAL_TABLET | Freq: Two times a day (BID) | ORAL | 0 refills | Status: AC | PRN

## 2024-11-08 NOTE — Assessment & Plan Note (Signed)
 Refer back to [facility].  I agreed to Tramadol for now.  Modified activity/lifting technique.

## 2024-11-08 NOTE — Assessment & Plan Note (Signed)
 Discussion as above.  May have to stop her thiazide if it persists.

## 2024-11-08 NOTE — Assessment & Plan Note (Signed)
Satisfactory control. 

## 2024-11-08 NOTE — Progress Notes (Signed)
 Established Patient Office Visit  Subjective   Patient ID: Cheryl Oneal, female    DOB: 1961-01-21  Age: 63 y.o. MRN: [id number]  Chief Complaint  Patient presents with   Back Pain    Back pain    F/u as above.  She reports ongoing lumbago, and didn't like her recent visit to the pain specialist.  She didn't see [facility], which was where I intended her to go to.  Recent renal insufficiency noted.  She avoids NSAIDS after we reviewed this in lay terms today.  She remains on only low dose Xigduo , and states she is only taking her hydrochlorothiazide at 12.5mg  daily.  Back Pain    Past Medical History:  Diagnosis Date   Chronic pain    Diabetes (HCC)    Dyslipidemia    Femur fracture, left (HCC)    2024   Hypertension    Osteoarthritis    Tobacco abuse    1/2 PPD for decades    Outpatient Encounter Medications as of 11/08/2024  Medication Sig   amitriptyline (ELAVIL) 50 MG tablet Take 50 mg by mouth at bedtime.   amLODipine (NORVASC) 10 MG tablet Take 10 mg by mouth daily.   aspirin EC 81 MG tablet Take 81 mg by mouth daily.   cyclobenzaprine (FLEXERIL) 10 MG tablet Take 10 mg by mouth every 8 (eight) hours as needed.   Dapagliflozin  Pro-metFORMIN  ER (XIGDUO  XR) 04-999 MG TB24 Take 2 tablets by mouth daily.   DULoxetine  (CYMBALTA ) 60 MG capsule Take 1 capsule (60 mg total) by mouth daily.   dutasteride (AVODART) 0.5 MG capsule Take 0.5 mg by mouth daily.   ergocalciferol (VITAMIN D2) 1.25 MG (50000 UT) capsule Take 50,000 Units by mouth once a week.   estradiol (ESTRACE) 0.1 MG/GM vaginal cream Place 1 g vaginally 2 (two) times a week.   hydrochlorothiazide (HYDRODIURIL) 25 MG tablet Take 12.5 mg by mouth daily. (Patient taking differently: Take 12.5 mg by mouth daily.)   lisinopril (ZESTRIL) 5 MG tablet Take 5 mg by mouth daily.   metoprolol succinate (TOPROL-XL) 100 MG 24 hr tablet Take 100 mg by mouth daily.   MOUNJARO  7.5 MG/0.5ML Pen INJECT 1 PEN SUBCUTANEOUSLY ONCE  A WEEK   Omega-3 Fatty Acids (FISH OIL) 1000 MG CAPS Take by mouth.   pravastatin (PRAVACHOL) 10 MG tablet Take 10 mg by mouth daily.   pregabalin  (LYRICA ) 75 MG capsule Take 1 capsule by mouth twice daily   traMADol (ULTRAM) 50 MG tablet Take 1 tablet (50 mg total) by mouth every 12 (twelve) hours as needed.   vitamin E 180 MG (400 UNITS) capsule Take 400 Units by mouth daily.   No facility-administered encounter medications on file as of 11/08/2024.    Social History   Tobacco Use   Smoking status: Some Days    Current packs/day: 1.50    Average packs/day: 1.5 packs/day for 44.9 years (67.3 ttl pk-yrs)    Types: Cigarettes    Start date: 61    Passive exposure: Current   Smokeless tobacco: Never  Vaping Use   Vaping status: Never Used  Substance Use Topics   Alcohol use: Never   Drug use: Never      Review of Systems  Musculoskeletal:  Positive for back pain.      Objective:     BP 108/67 (Cuff Size: Large)   Pulse 65   Temp 97.7 F (36.5 C) (Oral)   Resp 17   Wt 154 lb  11.2 oz (70.2 kg)   LMP  (LMP Unknown)   SpO2 95%   BMI 24.97 kg/m    Physical Exam Constitutional:      General: She is not in acute distress.    Appearance: Normal appearance.  HENT:     Head: Normocephalic.  Neck:     Vascular: No carotid bruit.  Cardiovascular:     Rate and Rhythm: Normal rate and regular rhythm.     Pulses: Normal pulses.     Heart sounds: Normal heart sounds.  Pulmonary:     Effort: Pulmonary effort is normal.     Breath sounds: Normal breath sounds.  Abdominal:     General: Bowel sounds are normal.     Palpations: Abdomen is soft.  Musculoskeletal:     Cervical back: Neck supple. No tenderness.     Right lower leg: No edema.     Left lower leg: No edema.     Comments: Mild lumbago with negative SLR's  Neurological:     General: No focal deficit present.     Mental Status: She is alert.      No results found for any visits on 11/08/24.    The  ASCVD Risk score (Arnett DK, et al., 2019) failed to calculate for the following reasons:   The valid total cholesterol range is 130 to 320 mg/dL    Assessment & Plan:  Chronic bilateral low back pain with right-sided sciatica Assessment & Plan: Refer back to [facility].  I agreed to Tramadol for now.  Modified activity/lifting technique.  Orders: -     traMADol HCl; Take 1 tablet (50 mg total) by mouth every 12 (twelve) hours as needed.  Dispense: 60 tablet; Refill: 0 -     Ambulatory referral to Orthopedic Surgery  Renal insufficiency Assessment & Plan: Discussion as above.  May have to stop her thiazide if it persists.  Orders: -     Basic metabolic panel with GFR  Primary hypertension Assessment & Plan: Satisfactory control.   Vitamin D deficiency -     VITAMIN D 25 Hydroxy (Vit-D Deficiency, Fractures)    Return in about 4 weeks (around 12/06/2024) for chronic follow-up.    REDDING PONCE NORLEEN FALCON., MD

## 2024-11-09 DIAGNOSIS — Z01419 Encounter for gynecological examination (general) (routine) without abnormal findings: Secondary | ICD-10-CM | POA: Diagnosis not present

## 2024-11-09 DIAGNOSIS — Z6826 Body mass index (BMI) 26.0-26.9, adult: Secondary | ICD-10-CM | POA: Diagnosis not present

## 2024-11-09 LAB — VITAMIN D 25 HYDROXY (VIT D DEFICIENCY, FRACTURES): Vit D, 25-Hydroxy: 48.4 ng/mL (ref 30.0–100.0)

## 2024-11-09 LAB — BASIC METABOLIC PANEL WITH GFR
BUN/Creatinine Ratio: 16 (ref 12–28)
BUN: 17 mg/dL (ref 8–27)
CO2: 23 mmol/L (ref 20–29)
Calcium: 8.9 mg/dL (ref 8.7–10.3)
Chloride: 105 mmol/L (ref 96–106)
Creatinine, Ser: 1.07 mg/dL — ABNORMAL HIGH (ref 0.57–1.00)
Glucose: 83 mg/dL (ref 70–99)
Potassium: 4.1 mmol/L (ref 3.5–5.2)
Sodium: 142 mmol/L (ref 134–144)
eGFR: 59 mL/min/1.73 — ABNORMAL LOW (ref >59)

## 2024-11-11 ENCOUNTER — Ambulatory Visit (HOSPITAL_BASED_OUTPATIENT_CLINIC_OR_DEPARTMENT_OTHER): Payer: Self-pay | Admitting: Family Medicine

## 2024-11-13 ENCOUNTER — Other Ambulatory Visit (HOSPITAL_BASED_OUTPATIENT_CLINIC_OR_DEPARTMENT_OTHER): Payer: Self-pay | Admitting: Family Medicine

## 2024-11-13 DIAGNOSIS — E11649 Type 2 diabetes mellitus with hypoglycemia without coma: Secondary | ICD-10-CM

## 2024-11-13 NOTE — Telephone Encounter (Signed)
 Copied from CRM #[id number]. Topic: Clinical - Medication Refill >> Nov 13, 2024  8:59 AM Amy B wrote: Medication:  MOUNJARO  7.5 MG/0.5ML Pen    Has the patient contacted their pharmacy? Yes (Agent: If no, request that the patient contact the pharmacy for the refill. If patient does not wish to contact the pharmacy document the reason why and proceed with request.) (Agent: If yes, when and what did the pharmacy advise?)  This is the patient's preferred pharmacy:  [facility] [address] [place] - [address] [place] [address] Phone: [phone] Fax: [phone]  Is this the correct pharmacy for this prescription? Yes If no, delete pharmacy and type the correct one.   Has the prescription been filled recently? No  Is the patient out of the medication? Yes  Has the patient been seen for an appointment in the last year OR does the patient have an upcoming appointment? Yes  Can we respond through MyChart? Yes  Agent: Please be advised that Rx refills may take up to 3 business days. We ask that you follow-up with your pharmacy.

## 2024-11-15 ENCOUNTER — Other Ambulatory Visit (HOSPITAL_BASED_OUTPATIENT_CLINIC_OR_DEPARTMENT_OTHER): Payer: Self-pay | Admitting: Family Medicine

## 2024-11-15 DIAGNOSIS — E11649 Type 2 diabetes mellitus with hypoglycemia without coma: Secondary | ICD-10-CM

## 2024-11-15 MED ORDER — MOUNJARO 7.5 MG/0.5ML ~~LOC~~ SOAJ: 7.5000 mg | SUBCUTANEOUS | 0 refills | Status: AC

## 2024-11-19 ENCOUNTER — Other Ambulatory Visit (HOSPITAL_COMMUNITY): Payer: Self-pay

## 2024-11-19 ENCOUNTER — Telehealth (HOSPITAL_BASED_OUTPATIENT_CLINIC_OR_DEPARTMENT_OTHER): Payer: Self-pay | Admitting: Pharmacy Technician

## 2024-11-19 ENCOUNTER — Other Ambulatory Visit: Payer: Self-pay | Admitting: "Endocrinology

## 2024-11-19 DIAGNOSIS — E1165 Type 2 diabetes mellitus with hyperglycemia: Secondary | ICD-10-CM

## 2024-11-19 NOTE — Telephone Encounter (Signed)
 Pharmacy Patient Advocate Encounter   Received notification from Onbase that prior authorization for traMADol  HCl 50MG  tablets is required/requested.   Insurance verification completed.   The patient is insured through [facility].   Per test claim: PA required; PA submitted to above mentioned insurance via Latent Key/confirmation #/EOC [facility] Status is pending

## 2024-11-22 NOTE — Telephone Encounter (Signed)
 Pharmacy Patient Advocate Encounter  Received notification from [facility] that Prior Authorization for traMADol  HCl 50MG  tablets has been DENIED.  Full denial letter will be uploaded to the media tab. See denial reason below.   PA #/Case ID/Reference #: [id number]

## 2024-11-25 ENCOUNTER — Telehealth: Payer: Self-pay

## 2024-11-25 NOTE — Telephone Encounter (Signed)
 Called pt lvm for pt to call back regarding medication refill.

## 2024-11-25 NOTE — Telephone Encounter (Signed)
-----   Message from [facility] sent at 11/19/2024  3:08 PM EST ----- Refill request for dapagliflozin /metformin  denied as patient was supposed to follow-up with her PCP, when last seen in 04/2024, if she still wants to follow-up with me, she will need appt

## 2024-11-27 ENCOUNTER — Telehealth (HOSPITAL_BASED_OUTPATIENT_CLINIC_OR_DEPARTMENT_OTHER): Payer: Self-pay | Admitting: Family Medicine

## 2024-11-27 ENCOUNTER — Telehealth: Payer: Self-pay

## 2024-11-27 DIAGNOSIS — E1165 Type 2 diabetes mellitus with hyperglycemia: Secondary | ICD-10-CM

## 2024-11-27 NOTE — Telephone Encounter (Signed)
 Pt called and informed office that she will have her PCP refill medication dapagliflozin /metformin .

## 2024-11-27 NOTE — Telephone Encounter (Signed)
-----   Message from [facility] sent at 11/19/2024  3:08 PM EST ----- Refill request for dapagliflozin /metformin  denied as patient was supposed to follow-up with her PCP, when last seen in 04/2024, if she still wants to follow-up with me, she will need appt

## 2024-11-27 NOTE — Telephone Encounter (Unsigned)
 Copied from CRM [id number]. Topic: Clinical - Medication Refill >> Nov 27, 2024  8:54 AM Donna BRAVO wrote: Medication:  Dapagliflozin  Pro-metFORMIN  ER (XIGDUO  XR) 04-999 MG TB24  Has the patient contacted their pharmacy? Yes Pharmacy stated they need new prescription written   This is the patient's preferred pharmacy:  [facility] [address] [place] - [address] [place] [address] Phone: [phone] Fax: [phone]   Is this the correct pharmacy for this prescription? Yes If no, delete pharmacy and type the correct one.   Has the prescription been filled recently? No  Is the patient out of the medication? Yes  Has the patient been seen for an appointment in the last year OR does the patient have an upcoming appointment? Yes  Can we respond through MyChart? Yes  Agent: Please be advised that Rx refills may take up to 3 business days. We ask that you follow-up with your pharmacy.

## 2024-11-28 ENCOUNTER — Other Ambulatory Visit (HOSPITAL_BASED_OUTPATIENT_CLINIC_OR_DEPARTMENT_OTHER): Payer: Self-pay | Admitting: Family Medicine

## 2024-11-28 DIAGNOSIS — G8929 Other chronic pain: Secondary | ICD-10-CM | POA: Diagnosis not present

## 2024-11-28 DIAGNOSIS — M545 Low back pain, unspecified: Secondary | ICD-10-CM | POA: Diagnosis not present

## 2024-11-28 DIAGNOSIS — M4316 Spondylolisthesis, lumbar region: Secondary | ICD-10-CM | POA: Diagnosis not present

## 2024-11-28 DIAGNOSIS — M5442 Lumbago with sciatica, left side: Secondary | ICD-10-CM | POA: Diagnosis not present

## 2024-11-28 DIAGNOSIS — M5441 Lumbago with sciatica, right side: Secondary | ICD-10-CM | POA: Diagnosis not present

## 2024-11-28 DIAGNOSIS — M5416 Radiculopathy, lumbar region: Secondary | ICD-10-CM | POA: Diagnosis not present

## 2024-11-28 DIAGNOSIS — M532X6 Spinal instabilities, lumbar region: Secondary | ICD-10-CM | POA: Diagnosis not present

## 2024-11-28 DIAGNOSIS — E1165 Type 2 diabetes mellitus with hyperglycemia: Secondary | ICD-10-CM

## 2024-11-28 MED ORDER — DAPAGLIFLOZIN PRO-METFORMIN ER 5-1000 MG PO TB24: 2.0000 | ORAL_TABLET | Freq: Every day | ORAL | 0 refills | Status: AC

## 2024-12-02 ENCOUNTER — Encounter: Payer: Self-pay | Admitting: Podiatry

## 2024-12-02 ENCOUNTER — Ambulatory Visit: Admitting: Podiatry

## 2024-12-02 DIAGNOSIS — Z720 Tobacco use: Secondary | ICD-10-CM

## 2024-12-02 DIAGNOSIS — E1140 Type 2 diabetes mellitus with diabetic neuropathy, unspecified: Secondary | ICD-10-CM | POA: Diagnosis not present

## 2024-12-02 DIAGNOSIS — B351 Tinea unguium: Secondary | ICD-10-CM | POA: Diagnosis not present

## 2024-12-02 DIAGNOSIS — M79674 Pain in right toe(s): Secondary | ICD-10-CM | POA: Diagnosis not present

## 2024-12-02 DIAGNOSIS — M79675 Pain in left toe(s): Secondary | ICD-10-CM | POA: Diagnosis not present

## 2024-12-02 DIAGNOSIS — I739 Peripheral vascular disease, unspecified: Secondary | ICD-10-CM

## 2024-12-02 NOTE — Patient Instructions (Signed)
 You can combine 3 tablespoons of coconut oil and 10-15 drops of tea tree oil together and apply to the toenails once a day.   Look for urea 40% nail gel cream or ointment and apply to the thickened to nails or dry skin / calluses. This can be bought over the counter, at a pharmacy or online such as [employer].

## 2024-12-02 NOTE — Progress Notes (Unsigned)
 Subjective:  Patient ID: Cheryl Oneal, female    DOB: 27-May-1961,  MRN: [id number]  Chief Complaint  Patient presents with   Nail Problem    Bilateral 1st toe pain, believes the nails are contributing to it.started in August after you tripped. No signs of an injury aside from discoloration on the nails. No change since then. Wearing shoes does cause some discomfort, especially close-toe. Last A1c: 6.2, takes asa 81 mg.     Discussed the use of AI scribe software for clinical note transcription with the patient, who gave verbal consent to proceed.  History of Present Illness Cheryl Oneal is a 63 year old female with diabetes who presents with pain in her big toes and changes in her toenails.  She has had big toe pain in shoes since late August. The pain began after falls that impacted her feet, after which she noticed abnormal appearance of her toenails and is worried she has stopped growing.  She has low back pain with sciatica radiating to her big toe, and past falls that contributed to the toenail injury. She has swelling in her feet and numbness in her toes. Lyrica  provides partial pain relief.  She has diabetes, with a recent A1c of 6.2. She smokes about 1.5 packs per day. She has significant toe numbness without a formal neuropathy diagnosis. She has constant bilateral leg pain that starts in the calves and moves up with walking, with no rest pain, which limits her ability to work.  She has not noticed loosening or loss of toenails and is hoping she will grow out. She usually needs assistance with toenail care and is overdue for this.      Objective:    Physical Exam CARDIOVASCULAR: Palpable dorsalis pedis pulse on the left foot, faintly palpable to non-palpable dorsalis pedis pulse on the right foot. Non-palpable posterior tibial pulse on the right, faintly palpable on the left. Left dorsalis pedis pulse audible on Doppler with multiphasic signal, weak monophasic signal on  left posterior tibial. Unable to find right posterior tibial Doppler signal, right dorsalis pedis signal faint on Doppler. MUSCULOSKELETAL: Bilateral foot contractions, presence of calluses and portal keratotic lesions. Right foot grazed compared to left foot. Muscle strength 5/5 in dorsiflexion, peripheral and knee vision. NEUROLOGICAL: Decreased protective, vibratory, and light touch sensation to toes.   No images are attached to the encounter.    Results LABS A1c: 6.2  DIAGNOSTIC Doppler Ultrasound Left Foot: Palpable dorsalis pedis (DP) pulse, faintly palpable posterior tibial (PT) pulse, DP pulse audible on Doppler multiphasic signal, weakly monophasic PT signal (12/02/2024) Doppler Ultrasound Right Foot: Faintly palpable to non-palpable DP pulse, non-palpable PT pulse, unable to find PT Doppler signal, faint DP signal on Doppler (12/02/2024)  Procedure: Toenail trimming Description: Trimmed thickened and discolored toenails, smoothed down surfaces.   Assessment:   1. PAD (peripheral artery disease)   2. Type 2 diabetes mellitus with diabetic neuropathy, without long-term current use of insulin (HCC)   3. Pain due to onychomycosis of toenails of both feet   4. Tobacco use      Plan:  Patient was evaluated and treated and all questions answered.  Assessment and Plan Assessment & Plan Type 2 diabetes mellitus with peripheral neuropathy Managed with Lyrica , alleviating some pain. Decreased protective, vibratory, and light touch sensation in toes. A1c well-controlled at 6.2. Smoking may impact circulation. - Continue Lyrica  for neuropathy management. - Advised smoking cessation to improve circulation.  Onychomycosis with traumatic nail changes Thickened, brittle,  discolored toenails, exacerbated by trauma and possible fungal infection. Pain when wearing shoes, particularly affecting big toes. Multiple layers of nail growth due to trauma. Potential fungal infection due to  diabetes. - Trimmed toenails to reduce thickness and discomfort. - Apply urea-based nail gel to soften nails. - Use coconut oil and tea tree oil mixture to soften nails and address fungal infection. - Avoid tight-fitting shoes. - Consider toenail removal if pain persists and circulation studies are normal.  Peripheral arterial disease of the lower extremities Faintly palpable to non-palpable pulses in right foot, decreased circulation. Pain in legs after walking, possibly related to circulation issues. Doppler studies show faint signals in right foot. - Ordered circulation studies to assess blood flow to the toes. - Refer to vascular specialist if significant abnormalities found in circulation studies. - Advised on importance of circulation in managing diabetes-related complications.  Calluses and keratotic lesions of the feet Require regular maintenance to prevent complications. - Trimmed and smoothed calluses and keratotic lesions. - Schedule regular maintenance every three months.      Return in about 3 weeks (around 12/23/2024) for PAD results/nail problem.   No audible signal left hallux

## 2024-12-05 ENCOUNTER — Ambulatory Visit (HOSPITAL_BASED_OUTPATIENT_CLINIC_OR_DEPARTMENT_OTHER): Admitting: Family Medicine

## 2024-12-06 DIAGNOSIS — M51362 Other intervertebral disc degeneration, lumbar region with discogenic back pain and lower extremity pain: Secondary | ICD-10-CM | POA: Diagnosis not present

## 2024-12-11 ENCOUNTER — Other Ambulatory Visit (HOSPITAL_BASED_OUTPATIENT_CLINIC_OR_DEPARTMENT_OTHER): Payer: Self-pay | Admitting: Family Medicine

## 2024-12-11 DIAGNOSIS — G894 Chronic pain syndrome: Secondary | ICD-10-CM

## 2024-12-11 MED ORDER — DULOXETINE HCL 60 MG PO CPEP: 60.0000 mg | ORAL_CAPSULE | Freq: Every day | ORAL | 1 refills | Status: AC

## 2024-12-12 ENCOUNTER — Ambulatory Visit (HOSPITAL_BASED_OUTPATIENT_CLINIC_OR_DEPARTMENT_OTHER): Admitting: Family Medicine

## 2024-12-12 ENCOUNTER — Encounter (HOSPITAL_BASED_OUTPATIENT_CLINIC_OR_DEPARTMENT_OTHER): Payer: Self-pay | Admitting: Family Medicine

## 2024-12-12 VITALS — BP 116/69 | HR 72 | Temp 97.7°F | Resp 16 | Wt 159.7 lb

## 2024-12-12 DIAGNOSIS — M5441 Lumbago with sciatica, right side: Secondary | ICD-10-CM

## 2024-12-12 DIAGNOSIS — I10 Essential (primary) hypertension: Secondary | ICD-10-CM | POA: Diagnosis not present

## 2024-12-12 DIAGNOSIS — N289 Disorder of kidney and ureter, unspecified: Secondary | ICD-10-CM

## 2024-12-12 DIAGNOSIS — Z720 Tobacco use: Secondary | ICD-10-CM | POA: Diagnosis not present

## 2024-12-12 DIAGNOSIS — G8929 Other chronic pain: Secondary | ICD-10-CM | POA: Diagnosis not present

## 2024-12-12 LAB — BASIC METABOLIC PANEL WITH GFR
BUN/Creatinine Ratio: 15 (ref 12–28)
BUN: 19 mg/dL (ref 8–27)
CO2: 23 mmol/L (ref 20–29)
Calcium: 9 mg/dL (ref 8.7–10.3)
Chloride: 100 mmol/L (ref 96–106)
Creatinine, Ser: 1.25 mg/dL — ABNORMAL HIGH (ref 0.57–1.00)
Glucose: 102 mg/dL — ABNORMAL HIGH (ref 70–99)
Potassium: 4.3 mmol/L (ref 3.5–5.2)
Sodium: 139 mmol/L (ref 134–144)
eGFR: 48 mL/min/1.73 — ABNORMAL LOW (ref >59)

## 2024-12-12 NOTE — Assessment & Plan Note (Addendum)
 Blood pressure is well-controlled with current medication regimen. Hydrochlorothiazide may be contributing to renal insufficiency. - Will consider discontinuing hydrochlorothiazide if kidney function is compromised - Monitor blood pressure and report any significant changes

## 2024-12-12 NOTE — Assessment & Plan Note (Addendum)
 F/u with [facility] as above.

## 2024-12-12 NOTE — Assessment & Plan Note (Addendum)
 Kidney function improving but requires monitoring due to potential impact of low dose hydrochlorothiazide. - Rechecked kidney function today - Will consider discontinuing hydrochlorothiazide if kidney function is compromised  Orders:   Basic metabolic panel with GFR

## 2024-12-12 NOTE — Assessment & Plan Note (Addendum)
 Discussed the option of using Chantix to aid in smoking cessation. She is considering this option. - Consider Chantix for smoking cessation   General health maintenance Mammogram is being scheduled. - Schedule mammogram

## 2024-12-12 NOTE — Progress Notes (Signed)
 Established Patient Office Visit  Subjective   Patient ID: Cheryl Oneal, female    DOB: [birth year]  Age: 63 y.o. MRN: [id number]  No chief complaint on file.   Discussed the use of AI scribe software for clinical note transcription with the patient, who gave verbal consent to proceed.  History of Present Illness Cheryl Oneal is a 63 year old female with hypertension and kidney function concerns who presents for follow-up on her medication regimen and kidney function.  She reduced her dosage of hydrochlorothiazide due to concerns about its impact on her kidney function. Her kidney function was checked about a month ago and showed improvement. Her blood pressure has remained stable despite the reduced dosage of the medication.   She experiences intermittent swelling in her feet, describing it as 'really, really puffy and uncomfortable' for two or three days at a time before it subsides. She manages her sodium intake by using sea salt or no salt.  I suggested properly fitted support stockings for her mild LE edema an another option.  She uses tramadol  approximately three times a week for pain management. She has seen a spine specialist who conducted an x-ray and MRI, revealing a disc issue. She has a follow-up appointment scheduled for February 11th.    Past Medical History:  Diagnosis Date   Chronic back pain    f/by [facility]   Diabetes (HCC)    Dyslipidemia    Femur fracture, left (HCC)    2024   Hypertension    Osteoarthritis    Tobacco abuse    1/2 PPD for decades    Outpatient Encounter Medications as of 12/12/2024  Medication Sig   aspirin EC 81 MG tablet Take 81 mg by mouth daily.   cyclobenzaprine (FLEXERIL) 10 MG tablet Take 10 mg by mouth every 8 (eight) hours as needed.   Dapagliflozin  Pro-metFORMIN  ER (XIGDUO  XR) 04-999 MG TB24 Take 2 tablets by mouth daily.   DULoxetine  (CYMBALTA ) 60 MG capsule Take 1 capsule (60 mg total) by mouth daily.    dutasteride (AVODART) 0.5 MG capsule Take 0.5 mg by mouth daily.   estradiol (ESTRACE) 0.1 MG/GM vaginal cream Place 1 g vaginally 2 (two) times a week.   lisinopril (ZESTRIL) 5 MG tablet Take 5 mg by mouth daily.   metoprolol succinate (TOPROL-XL) 100 MG 24 hr tablet Take 100 mg by mouth daily.   Omega-3 Fatty Acids (FISH OIL) 1000 MG CAPS Take by mouth.   pravastatin (PRAVACHOL) 10 MG tablet Take 10 mg by mouth daily.   pregabalin  (LYRICA ) 75 MG capsule Take 1 capsule by mouth twice daily   tirzepatide  (MOUNJARO ) 7.5 MG/0.5ML Pen Inject 7.5 mg into the skin once a week.   traMADol  (ULTRAM ) 50 MG tablet Take 1 tablet (50 mg total) by mouth every 12 (twelve) hours as needed.   vitamin E 180 MG (400 UNITS) capsule Take 400 Units by mouth daily.   hydrochlorothiazide (HYDRODIURIL) 25 MG tablet Take 12.5 mg by mouth daily.   No facility-administered encounter medications on file as of 12/12/2024.    Social History[1]    Review of Systems  Constitutional:  Negative for diaphoresis, fever, malaise/fatigue and weight loss.  Respiratory:  Negative for cough, shortness of breath and wheezing.   Cardiovascular:  Positive for leg swelling. Negative for chest pain, palpitations, orthopnea, claudication and PND.      Objective:     BP 116/69 (Cuff Size: Normal)   Pulse 72  Temp 97.7 F (36.5 C) (Oral)   Resp 16   Wt 159 lb 11.2 oz (72.4 kg)   LMP  (LMP Unknown)   SpO2 97%   BMI 25.78 kg/m    Physical Exam Constitutional:      General: She is not in acute distress.    Appearance: Normal appearance.  HENT:     Head: Normocephalic.  Neck:     Vascular: No carotid bruit.  Cardiovascular:     Rate and Rhythm: Normal rate and regular rhythm.     Pulses: Normal pulses.     Heart sounds: Normal heart sounds.  Pulmonary:     Effort: Pulmonary effort is normal.     Breath sounds: Normal breath sounds.  Abdominal:     General: Bowel sounds are normal.     Palpations: Abdomen is  soft.  Musculoskeletal:     Cervical back: Neck supple. No tenderness.     Right lower leg: No edema.     Left lower leg: No edema.  Neurological:     Mental Status: She is alert.      No results found for any visits on 12/12/24.    The ASCVD Risk score (Arnett DK, et al., 2019) failed to calculate for the following reasons:   The valid total cholesterol range is 130 to 320 mg/dL    Assessment & Plan:   Assessment & Plan Primary hypertension Blood pressure is well-controlled with current medication regimen. Hydrochlorothiazide may be contributing to renal insufficiency. - Will consider discontinuing hydrochlorothiazide if kidney function is compromised - Monitor blood pressure and report any significant changes    Renal insufficiency Kidney function improving but requires monitoring due to potential impact of low dose hydrochlorothiazide. - Rechecked kidney function today - Will consider discontinuing hydrochlorothiazide if kidney function is compromised  Orders:   Basic metabolic panel with GFR  Tobacco abuse Discussed the option of using Chantix to aid in smoking cessation. She is considering this option. - Consider Chantix for smoking cessation   General health maintenance Mammogram is being scheduled. - Schedule mammogram  Chronic bilateral low back pain with right-sided sciatica F/u with [facility] as above.         Return in about 3 months (around 03/12/2025) for chronic follow-up.    REDDING PONCE NORLEEN FALCON., MD     [1]  Social History Tobacco Use   Smoking status: Every Day    Current packs/day: 1.50    Average packs/day: 1.5 packs/day for 45.0 years (67.4 ttl pk-yrs)    Types: Cigarettes    Start date: 48    Passive exposure: Current   Smokeless tobacco: Never  Vaping Use   Vaping status: Never Used  Substance Use Topics   Alcohol use: Never   Drug use: Never

## 2024-12-13 ENCOUNTER — Ambulatory Visit (HOSPITAL_BASED_OUTPATIENT_CLINIC_OR_DEPARTMENT_OTHER): Admitting: Family Medicine

## 2024-12-13 ENCOUNTER — Ambulatory Visit (HOSPITAL_BASED_OUTPATIENT_CLINIC_OR_DEPARTMENT_OTHER): Payer: Self-pay | Admitting: Family Medicine

## 2024-12-31 ENCOUNTER — Ambulatory Visit: Admitting: Podiatry

## 2024-12-31 ENCOUNTER — Other Ambulatory Visit (HOSPITAL_BASED_OUTPATIENT_CLINIC_OR_DEPARTMENT_OTHER): Payer: Self-pay | Admitting: Family Medicine

## 2024-12-31 DIAGNOSIS — E11649 Type 2 diabetes mellitus with hypoglycemia without coma: Secondary | ICD-10-CM

## 2024-12-31 NOTE — Telephone Encounter (Unsigned)
 Copied from CRM [id number]. Topic: Clinical - Medication Refill >> Dec 31, 2024  1:32 PM Rachelle R wrote: Medication: pregabalin  (LYRICA ) 75 MG capsule  *patient states not longer seeing her Endocrinologist  Has the patient contacted their pharmacy? Yes, call dr  This is the patient's preferred pharmacy:  [facility] [address] [place] - [address] [place] [address] Phone: [phone] Fax: [phone]  Is this the correct pharmacy for this prescription? Yes  Has the prescription been filled recently? No  Is the patient out of the medication? Yes  Has the patient been seen for an appointment in the last year OR does the patient have an upcoming appointment? Yes  Can we respond through MyChart? Yes  Agent: Please be advised that Rx refills may take up to 3 business days. We ask that you follow-up with your pharmacy.

## 2025-01-01 ENCOUNTER — Telehealth (HOSPITAL_BASED_OUTPATIENT_CLINIC_OR_DEPARTMENT_OTHER): Payer: Self-pay | Admitting: *Deleted

## 2025-01-01 ENCOUNTER — Other Ambulatory Visit (HOSPITAL_BASED_OUTPATIENT_CLINIC_OR_DEPARTMENT_OTHER): Payer: Self-pay | Admitting: Family Medicine

## 2025-01-01 DIAGNOSIS — E11649 Type 2 diabetes mellitus with hypoglycemia without coma: Secondary | ICD-10-CM

## 2025-01-01 MED ORDER — PREGABALIN 75 MG PO CAPS: 75.0000 mg | ORAL_CAPSULE | Freq: Two times a day (BID) | ORAL | 0 refills | Status: AC

## 2025-01-01 NOTE — Telephone Encounter (Signed)
 LMOM

## 2025-01-14 ENCOUNTER — Other Ambulatory Visit: Payer: Self-pay

## 2025-01-14 ENCOUNTER — Other Ambulatory Visit: Payer: Self-pay | Admitting: Podiatry

## 2025-01-14 ENCOUNTER — Ambulatory Visit: Attending: Podiatry

## 2025-01-14 DIAGNOSIS — I739 Peripheral vascular disease, unspecified: Secondary | ICD-10-CM | POA: Diagnosis not present

## 2025-01-14 LAB — VAS US ABI WITH/WO TBI
Left ABI: 1.09
Right ABI: 0.7

## 2025-01-14 NOTE — Progress Notes (Signed)
 Abnormal ABI results noted, ordering Arterial ultrasound

## 2025-01-27 ENCOUNTER — Ambulatory Visit

## 2025-01-28 ENCOUNTER — Ambulatory Visit: Admitting: Podiatry
